# Patient Record
Sex: Female | Born: 1985 | Race: White | Hispanic: No | Marital: Married | Smoking: Former smoker
Health system: Southern US, Community
[De-identification: ages and names within clinical notes are randomized; demographics above are authoritative.]

## PROBLEM LIST (undated history)

## (undated) ENCOUNTER — Inpatient Hospital Stay (HOSPITAL_COMMUNITY): Payer: Self-pay

## (undated) DIAGNOSIS — F419 Anxiety disorder, unspecified: Secondary | ICD-10-CM

## (undated) DIAGNOSIS — Z789 Other specified health status: Secondary | ICD-10-CM

## (undated) DIAGNOSIS — J302 Other seasonal allergic rhinitis: Secondary | ICD-10-CM

## (undated) DIAGNOSIS — O139 Gestational [pregnancy-induced] hypertension without significant proteinuria, unspecified trimester: Secondary | ICD-10-CM

## (undated) DIAGNOSIS — J45909 Unspecified asthma, uncomplicated: Secondary | ICD-10-CM

## (undated) DIAGNOSIS — F329 Major depressive disorder, single episode, unspecified: Secondary | ICD-10-CM

## (undated) DIAGNOSIS — F32A Depression, unspecified: Secondary | ICD-10-CM

## (undated) HISTORY — DX: Depression, unspecified: F32.A

## (undated) HISTORY — DX: Unspecified asthma, uncomplicated: J45.909

## (undated) HISTORY — PX: EYE SURGERY: SHX253

## (undated) HISTORY — DX: Gestational (pregnancy-induced) hypertension without significant proteinuria, unspecified trimester: O13.9

## (undated) HISTORY — DX: Anxiety disorder, unspecified: F41.9

## (undated) HISTORY — PX: OTHER SURGICAL HISTORY: SHX169

## (undated) HISTORY — DX: Major depressive disorder, single episode, unspecified: F32.9

---
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
## 2010-11-13 ENCOUNTER — Emergency Department (HOSPITAL_COMMUNITY)
Admission: EM | Admit: 2010-11-13 | Discharge: 2010-11-13 | Payer: Self-pay | Source: Home / Self Care | Admitting: Family Medicine

## 2010-11-14 LAB — WET PREP, GENITAL
Clue Cells Wet Prep HPF POC: NONE SEEN
Trich, Wet Prep: NONE SEEN
Yeast Wet Prep HPF POC: NONE SEEN

## 2010-11-14 LAB — GLUCOSE, CAPILLARY: Glucose-Capillary: 78 mg/dL (ref 70–99)

## 2010-11-19 LAB — HERPES SIMPLEX VIRUS CULTURE: Culture: DETECTED

## 2010-11-19 LAB — GC/CHLAMYDIA PROBE AMP, GENITAL
Chlamydia, DNA Probe: NEGATIVE
GC Probe Amp, Genital: NEGATIVE

## 2011-12-17 ENCOUNTER — Ambulatory Visit (INDEPENDENT_AMBULATORY_CARE_PROVIDER_SITE_OTHER): Payer: BC Managed Care – PPO | Admitting: Physician Assistant

## 2011-12-17 VITALS — BP 118/78 | HR 93 | Temp 97.6°F | Resp 16 | Ht 63.75 in | Wt 172.2 lb

## 2011-12-17 DIAGNOSIS — J040 Acute laryngitis: Secondary | ICD-10-CM

## 2011-12-17 DIAGNOSIS — J069 Acute upper respiratory infection, unspecified: Secondary | ICD-10-CM

## 2011-12-17 DIAGNOSIS — R05 Cough: Secondary | ICD-10-CM

## 2011-12-17 DIAGNOSIS — J029 Acute pharyngitis, unspecified: Secondary | ICD-10-CM

## 2011-12-17 LAB — POCT CBC
Granulocyte percent: 62.6 %G (ref 37–80)
HCT, POC: 40.4 % (ref 37.7–47.9)
MCV: 86.9 fL (ref 80–97)
POC LYMPH PERCENT: 30.3 %L (ref 10–50)
RBC: 4.65 M/uL (ref 4.04–5.48)
WBC: 7.8 10*3/uL (ref 4.6–10.2)

## 2011-12-17 LAB — POCT RAPID STREP A (OFFICE): Rapid Strep A Screen: NEGATIVE

## 2011-12-17 MED ORDER — BENZONATATE 100 MG PO CAPS: ORAL_CAPSULE | ORAL | Status: AC

## 2011-12-17 MED ORDER — IPRATROPIUM BROMIDE 0.03 % NA SOLN: 2.0000 | Freq: Two times a day (BID) | NASAL | Status: DC

## 2011-12-17 MED ORDER — DM-GUAIFENESIN ER 60-1200 MG PO TB12: 1.0000 | ORAL_TABLET | Freq: Two times a day (BID) | ORAL | Status: AC

## 2011-12-17 NOTE — Patient Instructions (Signed)
Continue with the Mucinex, 1200 mg of the extended-release product every 12 hours.  Drink at least 64 ounces of water each day.  Add the nasal spray and cough med to help open up your sinus passages and reduce the cough.  If you are not improved over the next 24-48 hours, please call.

## 2011-12-17 NOTE — Progress Notes (Signed)
  Subjective:    Patient ID: Laura Jenkins, female    DOB: [birth year], 26 y.o.   MRN: [id number]  Fever  This is a new problem. The current episode started in the past 7 days. The problem occurs constantly. The problem has been gradually improving. Her temperature was unmeasured prior to arrival. Associated symptoms include congestion, coughing, headaches, a rash and a sore throat. Pertinent negatives include no abdominal pain, chest pain, diarrhea, ear pain, nausea, sleepiness, urinary pain, vomiting or wheezing. Treatments tried: mucinex, OTC homeopathics. The treatment provided no relief.  URI  This is a new problem. The current episode started in the past 7 days. The problem has been waxing and waning. Associated symptoms include congestion, coughing, headaches, a rash, rhinorrhea, sinus pain, a sore throat and swollen glands. Pertinent negatives include no abdominal pain, chest pain, diarrhea, dysuria, ear pain, joint pain, joint swelling, nausea, neck pain, sneezing, vomiting or wheezing. The treatment provided no relief.      Review of Systems  HENT: Positive for congestion, sore throat and rhinorrhea. Negative for ear pain, sneezing and neck pain.   Respiratory: Positive for cough. Negative for wheezing.   Cardiovascular: Negative for chest pain.  Gastrointestinal: Negative for nausea, vomiting, abdominal pain and diarrhea.  Genitourinary: Negative for dysuria.  Musculoskeletal: Negative for joint pain.  Skin: Positive for rash.  Neurological: Positive for headaches.       Objective:   Physical Exam  Vitals reviewed. Constitutional: She is oriented to person, place, and time. Vital signs are normal. She appears well-developed and well-nourished. No distress.  HENT:  Head: Normocephalic and atraumatic.  Right Ear: Hearing, tympanic membrane, external ear and ear canal normal.  Left Ear: Hearing, tympanic membrane, external ear and ear canal normal.  Nose: Mucosal  edema and rhinorrhea present.  No foreign bodies. Right sinus exhibits no maxillary sinus tenderness and no frontal sinus tenderness. Left sinus exhibits no maxillary sinus tenderness and no frontal sinus tenderness.  Mouth/Throat: Uvula is midline, oropharynx is clear and moist and mucous membranes are normal. No uvula swelling. No oropharyngeal exudate.  Eyes: Conjunctivae and EOM are normal. Pupils are equal, round, and reactive to light. Right eye exhibits no discharge. Left eye exhibits no discharge. No scleral icterus.  Neck: Trachea normal, normal range of motion and full passive range of motion without pain. Neck supple. No mass and no thyromegaly present.  Cardiovascular: Normal rate, regular rhythm and normal heart sounds.   Pulmonary/Chest: Effort normal and breath sounds normal.  Lymphadenopathy:       Head (right side): No submandibular, no tonsillar, no preauricular, no posterior auricular and no occipital adenopathy present.       Head (left side): No submandibular, no tonsillar, no preauricular and no occipital adenopathy present.    She has no cervical adenopathy.       Right: No supraclavicular adenopathy present.       Left: No supraclavicular adenopathy present.  Neurological: She is alert and oriented to person, place, and time. She has normal strength. No cranial nerve deficit or sensory deficit.  Skin: Skin is warm, dry and intact. No rash noted.  Psychiatric: She has a normal mood and affect. Her speech is normal and behavior is normal.       Assessment & Plan:  1. Pharyngitis. 2. Viral URI  Continue with current treatment. Add Atrovent NS BID and tessalon prn.  Anticipate improvement in 24-48 hours.

## 2012-03-05 ENCOUNTER — Ambulatory Visit (INDEPENDENT_AMBULATORY_CARE_PROVIDER_SITE_OTHER): Payer: BC Managed Care – PPO | Admitting: Physician Assistant

## 2012-03-05 VITALS — BP 110/68 | HR 92 | Temp 98.4°F | Resp 16 | Ht 63.5 in | Wt 170.0 lb

## 2012-03-05 DIAGNOSIS — J020 Streptococcal pharyngitis: Secondary | ICD-10-CM

## 2012-03-05 DIAGNOSIS — J029 Acute pharyngitis, unspecified: Secondary | ICD-10-CM

## 2012-03-05 LAB — POCT RAPID STREP A (OFFICE): Rapid Strep A Screen: POSITIVE — AB

## 2012-03-05 MED ORDER — PENICILLIN G BENZATHINE 1200000 UNIT/2ML IM SUSP
1.2000 10*6.[IU] | Freq: Once | INTRAMUSCULAR | Status: AC
  Administered 2012-03-05: 1.2 10*6.[IU] via INTRAMUSCULAR

## 2012-03-05 NOTE — Patient Instructions (Signed)
Strep Throat Strep throat is an infection of the throat caused by a bacteria named Streptococcus pyogenes. Your caregiver may call the infection streptococcal "tonsillitis" or "pharyngitis" depending on whether there are signs of inflammation in the tonsils or back of the throat. Strep throat is most common in children from 57 to 26 years old during the cold months of the year, but it can occur in people of any age during any season. This infection is spread from person to person (contagious) through coughing, sneezing, or other close contact. SYMPTOMS   Fever or chills.   Painful, swollen, red tonsils or throat.   Pain or difficulty when swallowing.   White or yellow spots on the tonsils or throat.   Swollen, tender lymph nodes or "glands" of the neck or under the jaw.   Red rash all over the body (rare).  DIAGNOSIS  Many different infections can cause the same symptoms. A test must be done to confirm the diagnosis so the right treatment can be given. A "rapid strep test" can help your caregiver make the diagnosis in a few minutes. If this test is not available, a light swab of the infected area can be used for a throat culture test. If a throat culture test is done, results are usually available in a day or two. TREATMENT  Strep throat is treated with antibiotic medicine. HOME CARE INSTRUCTIONS   Gargle with 1 tsp of salt in 1 cup of warm water, 3 to 4 times per day or as needed for comfort.   Family members who also have a sore throat or fever should be tested for strep throat and treated with antibiotics if they have the strep infection.   Make sure everyone in your household washes their hands well.   Do not share food, drinking cups, or personal items that could cause the infection to spread to others.   You may need to eat a soft food diet until your sore throat gets better.   Drink enough water and fluids to keep your urine clear or pale yellow. This will help prevent  dehydration.   Get plenty of rest.   Stay home from school, daycare, or work until you have been on antibiotics for 24 hours.   Only take over-the-counter or prescription medicines for pain, discomfort, or fever as directed by your caregiver.   If antibiotics are prescribed, take them as directed. Finish them even if you start to feel better.  SEEK MEDICAL CARE IF:   The glands in your neck continue to enlarge.   You develop a rash, cough, or earache.   You cough up green, yellow-brown, or bloody sputum.   You have pain or discomfort not controlled by medicines.   Your problems seem to be getting worse rather than better.  SEEK IMMEDIATE MEDICAL CARE IF:   You develop any new symptoms such as vomiting, severe headache, stiff or painful neck, chest pain, shortness of breath, or trouble swallowing.   You develop severe throat pain, drooling, or changes in your voice.   You develop swelling of the neck, or the skin on the neck becomes red and tender.   You have a fever.   You develop signs of dehydration, such as fatigue, dry mouth, and decreased urination.   You become increasingly sleepy, or you cannot wake up completely.  Document Released: 10/11/2000 Document Revised: 10/03/2011 Document Reviewed: 12/13/2010 [facility] Patient Information 2012 [place], [place].  You may take Ibuprofen and Acetaminophen for the pain of  your throat, and any fever. Drink at least 64 ounces of water daily and get plenty of rest.

## 2012-03-06 NOTE — Progress Notes (Signed)
  Subjective:    Patient ID: Laura Jenkins, female    DOB: [birth year], 26 y.o.   MRN: [id number]  HPI  Presents with several days of ST.  Initially had some fever/chills.  Feels tired.  No GI/GU symptoms.  No allergic-type symptoms. No cough. Feels different from the ST she gets will allergies and drainage.  Review of Systems As above.    Objective:   Physical Exam  Vital signs noted. Well-developed, well nourished WF who is awake, alert and oriented, in NAD. HEENT: [place]/AT, PERRL, EOMI.  Sclera and conjunctiva are clear.  EAC are patent, TMs are normal in appearance. Nasal mucosa is pink and moist. OP is clear. Neck: supple, no thyromegaly.  Bilateral cervical lymphadenopathy, mildly tender. Heart: RRR, no murmur Lungs: CTA Abdomen: normo-active bowel sounds, supple, non-tender, no mass or organomegaly. Extremities: no cyanosis, clubbing or edema. Skin: warm and dry without rash.  Results for orders placed in visit on 03/05/12  POCT RAPID STREP A (OFFICE)      Component Value Range   Rapid Strep A Screen Positive (*) Negative         Assessment & Plan:   1. Acute pharyngitis  POCT rapid strep A  2. Streptococcal sore throat  penicillin g benzathine (BICILLIN LA) 1200000 UNIT/2ML injection 1.2 Million Units   Anticipatory guidance provided.  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  Supportive care.

## 2014-04-22 ENCOUNTER — Encounter (HOSPITAL_COMMUNITY): Payer: Self-pay | Admitting: Emergency Medicine

## 2014-04-22 ENCOUNTER — Emergency Department (HOSPITAL_COMMUNITY)
Admission: EM | Admit: 2014-04-22 | Discharge: 2014-04-22 | Disposition: A | Discharge status: Home / Self Care | Payer: No Typology Code available for payment source | Attending: Emergency Medicine | Admitting: Emergency Medicine

## 2014-04-22 ENCOUNTER — Emergency Department (HOSPITAL_COMMUNITY): Payer: No Typology Code available for payment source

## 2014-04-22 DIAGNOSIS — Y92410 Unspecified street and highway as the place of occurrence of the external cause: Secondary | ICD-10-CM | POA: Insufficient documentation

## 2014-04-22 DIAGNOSIS — S2239XA Fracture of one rib, unspecified side, initial encounter for closed fracture: Secondary | ICD-10-CM | POA: Insufficient documentation

## 2014-04-22 DIAGNOSIS — M545 Low back pain, unspecified: Secondary | ICD-10-CM | POA: Diagnosis not present

## 2014-04-22 DIAGNOSIS — Y9389 Activity, other specified: Secondary | ICD-10-CM | POA: Diagnosis not present

## 2014-04-22 DIAGNOSIS — IMO0002 Reserved for concepts with insufficient information to code with codable children: Secondary | ICD-10-CM | POA: Diagnosis not present

## 2014-04-22 DIAGNOSIS — Z23 Encounter for immunization: Secondary | ICD-10-CM | POA: Insufficient documentation

## 2014-04-22 DIAGNOSIS — Z87891 Personal history of nicotine dependence: Secondary | ICD-10-CM | POA: Insufficient documentation

## 2014-04-22 DIAGNOSIS — S2231XA Fracture of one rib, right side, initial encounter for closed fracture: Secondary | ICD-10-CM

## 2014-04-22 MED ORDER — TETANUS-DIPHTH-ACELL PERTUSSIS 5-2.5-18.5 LF-MCG/0.5 IM SUSP
0.5000 mL | Freq: Once | INTRAMUSCULAR | Status: AC
  Administered 2014-04-22: 0.5 mL via INTRAMUSCULAR
  Filled 2014-04-22: qty 0.5

## 2014-04-22 MED ORDER — HYDROCODONE-ACETAMINOPHEN 5-325 MG PO TABS: 1.0000 | ORAL_TABLET | ORAL | Status: DC | PRN

## 2014-04-22 MED ORDER — IBUPROFEN 400 MG PO TABS
800.0000 mg | ORAL_TABLET | Freq: Once | ORAL | Status: AC
  Administered 2014-04-22: 800 mg via ORAL
  Filled 2014-04-22: qty 2

## 2014-04-22 MED ORDER — CYCLOBENZAPRINE HCL 10 MG PO TABS: 10.0000 mg | ORAL_TABLET | Freq: Three times a day (TID) | ORAL | Status: DC | PRN
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
## 2014-05-01 NOTE — ED Provider Notes (Signed)
Medical screening examination/treatment/procedure(s) were performed by non-physician practitioner and as supervising physician I was immediately available for consultation/collaboration.   EKG Interpretation None       Donnetta HutchingBrian Antaeus Karel, MD 05/01/14 2027

## 2014-10-28 NOTE — L&D Delivery Note (Signed)
Delivery Note  First Stage: Labor onset: 1400 on 09/26/2015 InductionAugmentation : Foley Balloon and Pitocin Analgesia /Anesthesia intrapartum: Fentanyl and Epidural AROM at 2337  Second Stage: Complete dilation at 0325 Onset of pushing at 0336 FHR second stage 145  Delivery of a viable female at 220415 by CNM in OA position No nuchal cord Cord double clamped immediately after delivery d/t increased laryngeal mucous and need for closer assessment in infant warmer, cut by FOB Cord blood sample collected    Third Stage: Placenta delivered via Tomasa BlaseSchultz intact with 3 VC @ 0426 Placenta disposition: L&D Uterine tone firm / bleeding minimal  1st degree vaginal and LT labia minora laceration identified  Anesthesia for repair: Epidural and Lidocaine (per pt request) Repair 3-0, 4-0 vicryl Est. Blood Loss (mL): 150  Complications: difficult fetal transition to external living - grunting, retractions / O2 and deep suctioning with DeLee by RN staff / CNM requested NICU evaluation / RN staff called nursery and instructed to bring infant to nursery immediately   Mom to postpartum.  Baby to Nursery on portable O2.  Newborn: Birth Weight: 7 lbs 3.9 oz  Apgar Scores: 6/7 Feeding planned: breast  Laura MoraAWSON, Graylin Sperling, M  MSN, CNM 09/27/2015, 5:00 AM

## 2015-04-10 LAB — OB RESULTS CONSOLE ABO/RH: RH Type: POSITIVE

## 2015-04-10 LAB — OB RESULTS CONSOLE GC/CHLAMYDIA
CHLAMYDIA, DNA PROBE: NEGATIVE
GC PROBE AMP, GENITAL: NEGATIVE

## 2015-04-10 LAB — OB RESULTS CONSOLE HEPATITIS B SURFACE ANTIGEN: Hepatitis B Surface Ag: NEGATIVE

## 2015-04-10 LAB — OB RESULTS CONSOLE RUBELLA ANTIBODY, IGM: RUBELLA: IMMUNE

## 2015-04-10 LAB — OB RESULTS CONSOLE ANTIBODY SCREEN: Antibody Screen: NEGATIVE

## 2015-04-10 LAB — OB RESULTS CONSOLE RPR: RPR: NONREACTIVE

## 2015-04-10 LAB — OB RESULTS CONSOLE HIV ANTIBODY (ROUTINE TESTING): HIV: NONREACTIVE

## 2015-09-07 LAB — OB RESULTS CONSOLE GBS: GBS: NEGATIVE

## 2015-09-26 ENCOUNTER — Encounter (HOSPITAL_COMMUNITY): Payer: Self-pay | Admitting: Obstetrics and Gynecology

## 2015-09-26 ENCOUNTER — Inpatient Hospital Stay (HOSPITAL_COMMUNITY)
Admission: AD | Admit: 2015-09-26 | Discharge: 2015-09-29 | DRG: 775 | Disposition: A | Discharge status: Home / Self Care | Payer: 59 | Source: Ambulatory Visit | Attending: Obstetrics & Gynecology | Admitting: Obstetrics & Gynecology

## 2015-09-26 DIAGNOSIS — O139 Gestational [pregnancy-induced] hypertension without significant proteinuria, unspecified trimester: Secondary | ICD-10-CM | POA: Diagnosis present

## 2015-09-26 DIAGNOSIS — D649 Anemia, unspecified: Secondary | ICD-10-CM | POA: Diagnosis not present

## 2015-09-26 DIAGNOSIS — O134 Gestational [pregnancy-induced] hypertension without significant proteinuria, complicating childbirth: Secondary | ICD-10-CM | POA: Diagnosis present

## 2015-09-26 DIAGNOSIS — Z3A39 39 weeks gestation of pregnancy: Secondary | ICD-10-CM | POA: Diagnosis not present

## 2015-09-26 DIAGNOSIS — O368130 Decreased fetal movements, third trimester, not applicable or unspecified: Secondary | ICD-10-CM | POA: Diagnosis present

## 2015-09-26 DIAGNOSIS — O9081 Anemia of the puerperium: Secondary | ICD-10-CM | POA: Diagnosis not present

## 2015-09-26 DIAGNOSIS — Z87891 Personal history of nicotine dependence: Secondary | ICD-10-CM

## 2015-09-26 HISTORY — DX: Other specified health status: Z78.9

## 2015-09-26 LAB — COMPREHENSIVE METABOLIC PANEL
ALK PHOS: 182 U/L — AB (ref 38–126)
ALT: 14 U/L (ref 14–54)
AST: 41 U/L (ref 15–41)
Albumin: 2.6 g/dL — ABNORMAL LOW (ref 3.5–5.0)
Anion gap: 11 (ref 5–15)
BUN: 9 mg/dL (ref 6–20)
CALCIUM: 9.2 mg/dL (ref 8.9–10.3)
CHLORIDE: 104 mmol/L (ref 101–111)
CO2: 18 mmol/L — AB (ref 22–32)
CREATININE: 0.57 mg/dL (ref 0.44–1.00)
Glucose, Bld: 101 mg/dL — ABNORMAL HIGH (ref 65–99)
Potassium: 4.9 mmol/L (ref 3.5–5.1)
Sodium: 133 mmol/L — ABNORMAL LOW (ref 135–145)
Total Bilirubin: 1 mg/dL (ref 0.3–1.2)
Total Protein: 6.5 g/dL (ref 6.5–8.1)

## 2015-09-26 LAB — CBC
HEMATOCRIT: 33.4 % — AB (ref 36.0–46.0)
HEMOGLOBIN: 11 g/dL — AB (ref 12.0–15.0)
MCH: 26.3 pg (ref 26.0–34.0)
MCHC: 32.9 g/dL (ref 30.0–36.0)
MCV: 79.9 fL (ref 78.0–100.0)
Platelets: 312 10*3/uL (ref 150–400)
RBC: 4.18 MIL/uL (ref 3.87–5.11)
RDW: 15 % (ref 11.5–15.5)
WBC: 16.2 10*3/uL — AB (ref 4.0–10.5)

## 2015-09-26 LAB — TYPE AND SCREEN
ABO/RH(D): O POS
ANTIBODY SCREEN: NEGATIVE

## 2015-09-26 LAB — PROTEIN / CREATININE RATIO, URINE
CREATININE, URINE: 106 mg/dL
Protein Creatinine Ratio: 0.18 mg/mg{Cre} — ABNORMAL HIGH (ref 0.00–0.15)
TOTAL PROTEIN, URINE: 19 mg/dL

## 2015-09-26 LAB — URIC ACID: Uric Acid, Serum: 6.3 mg/dL (ref 2.3–6.6)

## 2015-09-26 MED ORDER — FENTANYL CITRATE (PF) 100 MCG/2ML IJ SOLN
100.0000 ug | INTRAMUSCULAR | Status: DC | PRN
  Administered 2015-09-26 – 2015-09-27 (×2): 100 ug via INTRAVENOUS
  Filled 2015-09-26 (×2): qty 2

## 2015-09-26 MED ORDER — OXYTOCIN 40 UNITS IN LACTATED RINGERS INFUSION - SIMPLE MED
1.0000 m[IU]/min | INTRAVENOUS | Status: DC
  Administered 2015-09-26: 2 m[IU]/min via INTRAVENOUS
  Filled 2015-09-26: qty 1000

## 2015-09-26 MED ORDER — OXYCODONE-ACETAMINOPHEN 5-325 MG PO TABS: 1.0000 | ORAL_TABLET | ORAL | Status: DC | PRN

## 2015-09-26 MED ORDER — LACTATED RINGERS IV SOLN
INTRAVENOUS | Status: DC
  Administered 2015-09-26 – 2015-09-27 (×3): via INTRAVENOUS

## 2015-09-26 MED ORDER — LIDOCAINE HCL (PF) 1 % IJ SOLN
30.0000 mL | INTRAMUSCULAR | Status: AC | PRN
  Administered 2015-09-27: 30 mL via SUBCUTANEOUS
  Filled 2015-09-26: qty 30

## 2015-09-26 MED ORDER — OXYTOCIN 40 UNITS IN LACTATED RINGERS INFUSION - SIMPLE MED
62.5000 mL/h | INTRAVENOUS | Status: DC
  Administered 2015-09-27: 999 mL/h via INTRAVENOUS
  Filled 2015-09-26: qty 1000

## 2015-09-26 MED ORDER — TERBUTALINE SULFATE 1 MG/ML IJ SOLN
0.2500 mg | Freq: Once | INTRAMUSCULAR | Status: DC | PRN
  Filled 2015-09-26: qty 1

## 2015-09-26 MED ORDER — LACTATED RINGERS IV SOLN
500.0000 mL | INTRAVENOUS | Status: DC | PRN
  Administered 2015-09-26 (×2): 250 mL via INTRAVENOUS
  Administered 2015-09-27: 500 mL via INTRAVENOUS

## 2015-09-26 MED ORDER — CITRIC ACID-SODIUM CITRATE 334-500 MG/5ML PO SOLN: 30.0000 mL | ORAL | Status: DC | PRN

## 2015-09-26 MED ORDER — OXYTOCIN BOLUS FROM INFUSION: 500.0000 mL | INTRAVENOUS | Status: DC

## 2015-09-26 MED ORDER — OXYTOCIN 10 UNIT/ML IJ SOLN
10.0000 [IU] | Freq: Once | INTRAMUSCULAR | Status: DC | PRN
  Filled 2015-09-26: qty 1

## 2015-09-26 MED ORDER — OXYCODONE-ACETAMINOPHEN 5-325 MG PO TABS: 2.0000 | ORAL_TABLET | ORAL | Status: DC | PRN

## 2015-09-26 MED ORDER — ZOLPIDEM TARTRATE 5 MG PO TABS: 5.0000 mg | ORAL_TABLET | Freq: Every evening | ORAL | Status: DC | PRN

## 2015-09-26 MED ORDER — ACETAMINOPHEN 325 MG PO TABS: 650.0000 mg | ORAL_TABLET | ORAL | Status: DC | PRN

## 2015-09-27 ENCOUNTER — Inpatient Hospital Stay (HOSPITAL_COMMUNITY): Payer: 59 | Admitting: Anesthesiology

## 2015-09-27 ENCOUNTER — Encounter (HOSPITAL_COMMUNITY): Payer: Self-pay | Admitting: Obstetrics and Gynecology

## 2015-09-27 LAB — RPR: RPR Ser Ql: NONREACTIVE

## 2015-09-27 LAB — ABO/RH: ABO/RH(D): O POS

## 2015-09-27 MED ORDER — SIMETHICONE 80 MG PO CHEW: 80.0000 mg | CHEWABLE_TABLET | ORAL | Status: DC | PRN

## 2015-09-27 MED ORDER — LIDOCAINE HCL (PF) 1 % IJ SOLN
INTRAMUSCULAR | Status: DC | PRN
  Administered 2015-09-27 (×2): 8 mL via EPIDURAL

## 2015-09-27 MED ORDER — TETANUS-DIPHTH-ACELL PERTUSSIS 5-2.5-18.5 LF-MCG/0.5 IM SUSP: 0.5000 mL | Freq: Once | INTRAMUSCULAR | Status: DC

## 2015-09-27 MED ORDER — PHENYLEPHRINE 40 MCG/ML (10ML) SYRINGE FOR IV PUSH (FOR BLOOD PRESSURE SUPPORT)
80.0000 ug | PREFILLED_SYRINGE | INTRAVENOUS | Status: DC | PRN
  Filled 2015-09-27: qty 20
  Filled 2015-09-27: qty 2

## 2015-09-27 MED ORDER — OXYCODONE-ACETAMINOPHEN 5-325 MG PO TABS
1.0000 | ORAL_TABLET | ORAL | Status: DC | PRN
  Administered 2015-09-27 – 2015-09-28 (×2): 1 via ORAL
  Filled 2015-09-27: qty 1

## 2015-09-27 MED ORDER — FENTANYL 2.5 MCG/ML BUPIVACAINE 1/10 % EPIDURAL INFUSION (WH - ANES)
14.0000 mL/h | INTRAMUSCULAR | Status: DC | PRN
  Administered 2015-09-27: 14 mL/h via EPIDURAL
  Filled 2015-09-27: qty 125

## 2015-09-27 MED ORDER — DIPHENHYDRAMINE HCL 25 MG PO CAPS: 25.0000 mg | ORAL_CAPSULE | Freq: Four times a day (QID) | ORAL | Status: DC | PRN

## 2015-09-27 MED ORDER — DIBUCAINE 1 % RE OINT: 1.0000 "application " | TOPICAL_OINTMENT | RECTAL | Status: DC | PRN

## 2015-09-27 MED ORDER — PRENATAL MULTIVITAMIN CH
1.0000 | ORAL_TABLET | Freq: Every day | ORAL | Status: DC
  Administered 2015-09-27 – 2015-09-29 (×3): 1 via ORAL
  Filled 2015-09-27 (×3): qty 1

## 2015-09-27 MED ORDER — ONDANSETRON HCL 4 MG PO TABS: 4.0000 mg | ORAL_TABLET | ORAL | Status: DC | PRN

## 2015-09-27 MED ORDER — BENZOCAINE-MENTHOL 20-0.5 % EX AERO
1.0000 "application " | INHALATION_SPRAY | CUTANEOUS | Status: DC | PRN
  Administered 2015-09-27 – 2015-09-28 (×2): 1 via TOPICAL
  Filled 2015-09-27 (×2): qty 56

## 2015-09-27 MED ORDER — SENNOSIDES-DOCUSATE SODIUM 8.6-50 MG PO TABS
2.0000 | ORAL_TABLET | ORAL | Status: DC
  Administered 2015-09-28 (×2): 2 via ORAL
  Filled 2015-09-27 (×2): qty 2

## 2015-09-27 MED ORDER — WITCH HAZEL-GLYCERIN EX PADS: 1.0000 "application " | MEDICATED_PAD | CUTANEOUS | Status: DC | PRN

## 2015-09-27 MED ORDER — OXYCODONE-ACETAMINOPHEN 5-325 MG PO TABS
2.0000 | ORAL_TABLET | ORAL | Status: DC | PRN
  Filled 2015-09-27: qty 2

## 2015-09-27 MED ORDER — IPRATROPIUM BROMIDE 0.03 % NA SOLN: 2.0000 | Freq: Two times a day (BID) | NASAL | Status: DC

## 2015-09-27 MED ORDER — DIPHENHYDRAMINE HCL 50 MG/ML IJ SOLN: 12.5000 mg | INTRAMUSCULAR | Status: DC | PRN

## 2015-09-27 MED ORDER — IBUPROFEN 600 MG PO TABS
600.0000 mg | ORAL_TABLET | Freq: Four times a day (QID) | ORAL | Status: DC
  Administered 2015-09-27 – 2015-09-29 (×8): 600 mg via ORAL
  Filled 2015-09-27 (×8): qty 1

## 2015-09-27 MED ORDER — LORATADINE 10 MG PO TABS
10.0000 mg | ORAL_TABLET | Freq: Every day | ORAL | Status: DC
  Filled 2015-09-27: qty 1

## 2015-09-27 MED ORDER — EPHEDRINE 5 MG/ML INJ
10.0000 mg | INTRAVENOUS | Status: DC | PRN
  Filled 2015-09-27: qty 2

## 2015-09-27 MED ORDER — ACETAMINOPHEN 325 MG PO TABS: 650.0000 mg | ORAL_TABLET | ORAL | Status: DC | PRN

## 2015-09-27 MED ORDER — ZOLPIDEM TARTRATE 5 MG PO TABS: 5.0000 mg | ORAL_TABLET | Freq: Every evening | ORAL | Status: DC | PRN

## 2015-09-27 MED ORDER — ONDANSETRON HCL 4 MG/2ML IJ SOLN: 4.0000 mg | INTRAMUSCULAR | Status: DC | PRN

## 2015-09-27 NOTE — Progress Notes (Signed)
Patient ID: Laura Jenkins, female   DOB: 28-Jan-1986, 29 y.o.   MRN: [id number] TC @ 0158 to come for delivery  S: Very anxious, crying. Reporting feeling "lots of pressure like I have to poop!" Pain minimally controlled with an epidural d/t delay with insertion. Strong urge to push.   Ceasar Mons: Filed Vitals:   09/27/15 0419 09/27/15 0425 09/27/15 0430 09/27/15 0446  BP: 144/77 131/95 140/58 98/53  Pulse: 108 120 109 126  Temp:      TempSrc:      Resp:      Height:      Weight:      SpO2:         FHT:  FHR: 135 bpm, variability: moderate,  accelerations:  Present,  decelerations:  Present early decels with contractions UC:   regular, every 1-2 minutes SVE:   Dilation: 9.5 Effacement (%): 100 Station: +1 Exam by: Carloyn Jaeger. Dovie Kapusta, CNM   A / P: Induction of labor due to elective d/t transient/labile BPs,  progressing well on pitocin  Fetal Wellbeing:  Category I Pain Control:  Epidural Anticipate complete dilation within 30-60 mins / attempt laboring down for as long as tolerable / attempt pushing with constant urge to push Anticipated MOD:  NSVD   Raelyn MoraAWSON, Joanthony Hamza, M MSN, CNM 09/27/2015, 2:00 AM

## 2015-09-27 NOTE — Anesthesia Postprocedure Evaluation (Addendum)
Anesthesia Post Note  Patient: Laura Jenkins  Procedure(s) Performed: * No procedures listed *  Patient location during evaluation: Mother Baby Anesthesia Type: Epidural Level of consciousness: awake Pain management: satisfactory to patient Vital Signs Assessment: post-procedure vital signs reviewed and stable Respiratory status: spontaneous breathing Cardiovascular status: stable Anesthetic complications: no    Last Vitals:  Filed Vitals:   09/27/15 0650 09/27/15 0800  BP: 143/74 136/61  Pulse: 152 101  Temp: 36.7 C 36.7 C  Resp: 20 20    Last Pain:  Filed Vitals:   09/27/15 0828  PainSc: 1                  Avyn Aden

## 2015-09-27 NOTE — Anesthesia Procedure Notes (Signed)
Epidural Patient location during procedure: OB Start time: 09/27/2015 1:48 AM End time: 09/27/2015 1:52 AM  Staffing Anesthesiologist: Leilani AbleHATCHETT, Cele Mote Performed by: anesthesiologist   Preanesthetic Checklist Completed: patient identified, surgical consent, pre-op evaluation, timeout performed, IV checked, risks and benefits discussed and monitors and equipment checked  Epidural Patient position: sitting Prep: site prepped and draped and DuraPrep Patient monitoring: continuous pulse ox and blood pressure Approach: midline Location: L3-L4 Injection technique: LOR air  Needle:  Needle type: Tuohy  Needle gauge: 17 G Needle length: 9 cm and 9 Needle insertion depth: 7 cm Catheter type: closed end flexible Catheter size: 19 Gauge Catheter at skin depth: 12 cm Test dose: negative and Other  Assessment Sensory level: T9 Events: blood not aspirated, injection not painful, no injection resistance, negative IV test and no paresthesia  Additional Notes Reason for block:procedure for pain

## 2015-09-27 NOTE — Anesthesia Preprocedure Evaluation (Signed)
Anesthesia Evaluation  Patient identified by MRN, date of birth, ID band Patient awake    Reviewed: Allergy & Precautions, H&P , NPO status , Patient's Chart, lab work & pertinent test results  Airway Mallampati: I  TM Distance: >3 FB Neck ROM: full    Dental no notable dental hx.    Pulmonary former smoker,    Pulmonary exam normal        Cardiovascular negative cardio ROS Normal cardiovascular exam Rhythm:regular     Neuro/Psych negative neurological ROS  negative psych ROS   GI/Hepatic negative GI ROS, Neg liver ROS,   Endo/Other  negative endocrine ROS  Renal/GU negative Renal ROS     Musculoskeletal   Abdominal (+) + obese,   Peds  Hematology negative hematology ROS (+)   Anesthesia Other Findings   Reproductive/Obstetrics (+) Pregnancy                             Anesthesia Physical Anesthesia Plan  ASA: II  Anesthesia Plan: Epidural   Post-op Pain Management:    Induction:   Airway Management Planned:   Additional Equipment:   Intra-op Plan:   Post-operative Plan:   Informed Consent: I have reviewed the patients History and Physical, chart, labs and discussed the procedure including the risks, benefits and alternatives for the proposed anesthesia with the patient or authorized representative who has indicated his/her understanding and acceptance.     Plan Discussed with:   Anesthesia Plan Comments:         Anesthesia Quick Evaluation

## 2015-09-27 NOTE — H&P (Signed)
OB ADMISSION/ HISTORY & PHYSICAL:  Admission Date: 09/26/2015  5:36 PM  Admit Diagnosis: IOL for transient hypertension / decreased FM / category 2 FHR tracing in office / labile BPs in office x 1 week  Laura Jenkins is a 29 y.o. female presenting for IOL.  Prenatal History: G1P0   EDC : 10/02/2015, by Ultrasound Prenatal care at [facility] since [redacted] weeks gestation Primary Care Provider at [facility]: Dr. Juliene Pina  Prenatal course complicated by Transient hypertension in later third trimester  Prenatal Labs: ABO, Rh: O (06/13 0000)  Antibody: NEG (11/29 2125) Rubella: Immune (06/13 0000)  RPR: Nonreactive (06/13 0000)  HBsAg: Negative (06/13 0000)  HIV: Non-reactive (06/13 0000)  GBS: Negative (11/10 0000)  GTT : Abnormal 1 hr GTT / Normal 3 hr GTT   Medical / Surgical History :  Past medical history:  Past Medical History  Diagnosis Date  . Indication for care in labor or delivery 09/26/2015  . Medical history non-contributory      Past surgical history:  Past Surgical History  Procedure Laterality Date  . Strabismus surgery  1989     Family History: History reviewed. No pertinent family history.   Social History:  reports that she quit smoking about 6 years ago. She does not have any smokeless tobacco history on file. She reports that she does not drink alcohol or use illicit drugs.   Allergies: Review of patient's allergies indicates no known allergies.    Current Medications at time of admission:  Prescriptions prior to admission  Medication Sig Dispense Refill Last Dose  . calcium carbonate (TUMS - DOSED IN MG ELEMENTAL CALCIUM) 500 MG chewable tablet Chew 2 tablets by mouth daily as needed for indigestion or heartburn.   Past Week at Unknown time  . loratadine (CLARITIN) 10 MG tablet Take 10 mg by mouth daily.   09/26/2015 at Unknown time  . Prenatal Vit-Min-FA-Fish Oil ([employer] PRENATAL GUMMY PO) Take 2 each by mouth daily.    09/26/2015 at Unknown time  . valACYclovir (VALTREX) 500 MG tablet Take 500 mg by mouth daily.   09/26/2015 at Unknown time  . [DISCONTINUED] cyclobenzaprine (FLEXERIL) 10 MG tablet Take 1 tablet (10 mg total) by mouth 3 (three) times daily as needed for muscle spasms (or pain). 15 tablet 0   . [DISCONTINUED] HYDROcodone-acetaminophen (NORCO/VICODIN) 5-325 MG per tablet Take 1 tablet by mouth every 4 (four) hours as needed for moderate pain or severe pain. 15 tablet 0   . [DISCONTINUED] ipratropium (ATROVENT) 0.03 % nasal spray Place 2 sprays into the nose 2 (two) times daily. 30 mL 0       Review of Systems: Active FM onset of ctx @ 0800 currently irregular every 5-10 minutes bloody show none   Physical Exam:   Today's Vitals   09/26/15 2201 09/26/15 2230 09/26/15 2300 09/26/15 2308  BP: 123/59 97/57 109/58 93/52  Pulse: 87 93 84 89  Temp: 98.5 F (36.9 C)     TempSrc: Oral     Resp: 20     Height:      Weight:      PainSc: 0-No pain      General: A&O x 3, NAD Heart: RRR, no murmurs Lungs: CTAB, unlabored  Abdomen: gravid, soft, mild contractions Extremities: trace edema Genitalia / VE: Dilation: 1.0 Effacement (%): 90 Station: -1 Exam by:: Carloyn Jaeger, CNM  Foley Balloon inserted without difficulty / 60 mL of LR instilled in balloon of catheter /  traction applied  FHR: 140 bpm / moderate variability / accels present / no decels TOCO: regular every 1.5-3 mins  Labs:     Recent Labs  09/26/15 1800  WBC 16.2*  HGB 11.0*  HCT 33.4*  PLT 312   Results for orders placed or performed during the hospital encounter of 09/26/15 (from the past 24 hour(s))  CBC     Status: Abnormal   Collection Time: 09/26/15  6:00 PM  Result Value Ref Range   WBC 16.2 (H) 4.0 - 10.5 K/uL   RBC 4.18 3.87 - 5.11 MIL/uL   Hemoglobin 11.0 (L) 12.0 - 15.0 g/dL   HCT 16.133.4 (L) 09.636.0 - 04.546.0 %   MCV 79.9 78.0 - 100.0 fL   MCH 26.3 26.0 - 34.0 pg   MCHC 32.9 30.0 - 36.0 g/dL   RDW 40.915.0 81.111.5  - 91.415.5 %   Platelets 312 150 - 400 K/uL  Comprehensive metabolic panel     Status: Abnormal   Collection Time: 09/26/15  6:00 PM  Result Value Ref Range   Sodium 133 (L) 135 - 145 mmol/L   Potassium 4.9 3.5 - 5.1 mmol/L   Chloride 104 101 - 111 mmol/L   CO2 18 (L) 22 - 32 mmol/L   Glucose, Bld 101 (H) 65 - 99 mg/dL   BUN 9 6 - 20 mg/dL   Creatinine, Ser 7.820.57 0.44 - 1.00 mg/dL   Calcium 9.2 8.9 - 95.610.3 mg/dL   Total Protein 6.5 6.5 - 8.1 g/dL   Albumin 2.6 (L) 3.5 - 5.0 g/dL   AST 41 15 - 41 U/L   ALT 14 14 - 54 U/L   Alkaline Phosphatase 182 (H) 38 - 126 U/L   Total Bilirubin 1.0 0.3 - 1.2 mg/dL   GFR calc non Af Amer >60 >60 mL/min   GFR calc Af Amer >60 >60 mL/min   Anion gap 11 5 - 15  Uric acid     Status: None   Collection Time: 09/26/15  6:00 PM  Result Value Ref Range   Uric Acid, Serum 6.3 2.3 - 6.6 mg/dL  Protein / creatinine ratio, urine     Status: Abnormal   Collection Time: 09/26/15  6:45 PM  Result Value Ref Range   Creatinine, Urine 106.00 mg/dL   Total Protein, Urine 19 mg/dL   Protein Creatinine Ratio 0.18 (H) 0.00 - 0.15 mg/mg[Cre]  Type and screen [facility]     Status: None   Collection Time: 09/26/15  9:25 PM  Result Value Ref Range   ABO/RH(D) O POS    Antibody Screen NEG    Sample Expiration 09/29/2015      Assessment:  29 y.o. G1P0 at 5752w2d  1. Labor: Early 2. Fetal Wellbeing: Category 1  3. Pain Control: none 4. GBS: Negative   Plan:  1. Admit to BS 2. Routine L&D orders 3. Analgesia/anesthesia PRN  4. Insert Foley Balloon with pitocin / AROM when in active labor 5. D/C pitocin after AROM and in active labor, pending category 1 FHR tracing continues    Dr. Seymour BarsLavoie notified of admission / plan of care    [name], [name], M, MSN, CNM 09/26/2015, 10:30 PM

## 2015-09-27 NOTE — Progress Notes (Signed)
Patient ID: Dorathy DaftElizabeth M Ignelzi-Alvarenga, female   DOB: May 12, 1986, 29 y.o.   MRN: [id number]   Subjective: Dorathy Daftlizabeth M Ignelzi-Alvarenga is a 29 y.o. G1P0 at 922w2d by ultrasound admitted for induction of labor due to Elective at term for transient hypertension x 1 week. Planning waterbirth.  Objective: Filed Vitals:   09/26/15 2201 09/26/15 2230 09/26/15 2300 09/26/15 2308  BP: 123/59 97/57 109/58 93/52  Pulse: 87 93 84 89  Temp: 98.5 F (36.9 C)     TempSrc: Oral     Resp: 20     Height:      Weight:           FHT:  FHR: 140 bpm, variability: moderate,  accelerations:  Present,  decelerations:  Absent UC:   regular, every 4-5 minutes SVE:   Dilation: 4.5 Effacement (%): 90 Station: -1 Exam by:: Carloyn Jaeger. Tishanna Dunford, CNM AROM with clear fluid  Labs:   Recent Labs  09/26/15 1800  WBC 16.2*  HGB 11.0*  HCT 33.4*  PLT 312    Assessment / Plan: Induction of labor due to elective for transient hypertension,  progressing well on pitocin  Labor: Progressing on Pitocin, will continue to increase  Preeclampsia:  no signs or symptoms of toxicity and labs stable Fetal Wellbeing:  Category I Pain Control:  Fentanyl - requesting epidural I/D:  n/a Anticipated MOD:  NSVD  Kenard GowerAWSON, Jasminemarie Sherrard, M, MSN, CNM 09/27/2015, 11:30 PM

## 2015-09-27 NOTE — Lactation Note (Signed)
This note was copied from the chart of Laura Jenkins. Lactation Consultation Note:  Staff nurse request to see mother  stating that infant was having difficulty latching with increased RR. Mother also has increased anxiety due to infant not having a feeding . Infant is 6 hours old and has had 2 breast attempts no sustained latch. Mother states she has a history of anxiety. She states that her birth plan failed. Mother was given support .Lactation brochure given with basic teaching done. Mother taught to hand express. Infant was fed 10 ml of EBM with a spoon and began cueing. Mother has large amts of colostrum at this time. Infant latched on the right breast in football hold and sustained latch for 25 mins. Lots of teaching with mother and review of cue card and baby and me book. Father of infant returned . Assist mother with latching infant to the alternate breast. The left nipple is semi-flat . Mother taught to firm nipple with her fingers and to latch infant with nipple to nose technique. Infant sustained latch for 10 or more mins. Father at bedside for teaching. Mother advised to breastfeed infant with feeding cues at least 8-12 times in 24 hours. Advised to do frequent STS. Mother had 6 visitors waiting behind curtain to see the infant. Mother advised to nap . She has been awake all night. Cluster feeding discussed.   Patient Name: Laura Jenkins ZOXWR'UToday's Date: 09/27/2015 Reason for consult: Initial assessment   Maternal Data Has patient been taught Hand Expression?: Yes Does the patient have breastfeeding experience prior to this delivery?: No  Feeding Feeding Type: Breast Fed Length of feed: 10 min  LATCH Score/Interventions Latch: Grasps breast easily, tongue down, lips flanged, rhythmical sucking.  Audible Swallowing: Spontaneous and intermittent  Type of Nipple: Everted at rest and after stimulation  Comfort (Breast/Nipple): Soft / non-tender      Hold (Positioning): Assistance needed to correctly position infant at breast and maintain latch. Intervention(s): Breastfeeding basics reviewed;Support Pillows;Position options;Skin to skin  LATCH Score: 9  Lactation Tools Discussed/Used     Consult Status Consult Status: Follow-up Date: 09/27/15 Follow-up type: In-patient    Stevan BornKendrick, [facility] 09/27/2015, 2:45 PM

## 2015-09-28 ENCOUNTER — Encounter (HOSPITAL_COMMUNITY): Payer: Self-pay | Admitting: Obstetrics and Gynecology

## 2015-09-28 LAB — CBC
HEMATOCRIT: 28.8 % — AB (ref 36.0–46.0)
HEMOGLOBIN: 9.5 g/dL — AB (ref 12.0–15.0)
MCH: 26.5 pg (ref 26.0–34.0)
MCHC: 33 g/dL (ref 30.0–36.0)
MCV: 80.4 fL (ref 78.0–100.0)
Platelets: 343 10*3/uL (ref 150–400)
RBC: 3.58 MIL/uL — ABNORMAL LOW (ref 3.87–5.11)
RDW: 15.4 % (ref 11.5–15.5)
WBC: 17.4 10*3/uL — ABNORMAL HIGH (ref 4.0–10.5)

## 2015-09-28 NOTE — Progress Notes (Signed)
CSW received request for consult from RN due to MOB presenting as anxious, nervous, and overwhelmed.  CSW completed chart review and noted that MOB was nervous and anxious about breastfeeding and unanticipated change in her birth plan.   CSW attempted to meet with MOB; however, she was unable to meet at time of attempt.   CSW will continue to put forth effort to meet with MOB prior to discharge to provide support, to assist her to process her feelings, and assess need for follow up postpartum.  

## 2015-09-28 NOTE — Lactation Note (Signed)
This note was copied from the chart of Laura Jenkins. Lactation Consultation Note Having difficulty latching to breast. Nipples evert and compress to short shaft and baby unable to latch. Mom hasn't been wearing the shells that were given and #20 NS that was given. Explained to mom that for now she needed to use the NS to latch the baby until her edema to LE goes down and hopefully from her breast as well. Has lots of free flowing colostrum. Hand expressed 8ml colostrum. Baby latched well to NS in football hold. Mom wanting baby to constantly suckle. Explained baby needs to rest at intervals during BF. Noted increased respirations. Counted 95, then 10 min. Later 97. Reported to RN and Publishing rights managernursery RN. Explained to mom that the baby shouldn't BF if respirations are that high. Encouraged STS and call RN to let her know when she notices increased respirations. RN took baby to nursery to be monitored.  Mom has DEBP in room, encouraged to post pump if she feels baby didn't feed well. Explained probably would get more colostrum by hand expressing. Gave vial, curve tip syring, and dropper. Discussed options of BF.  Mom states she can't latch w/o help. Explained she needed to learn to latch w/o assistance and baby is ready to eat after respirations lower. Extensive teaching and teach back done to assist mom in "getting the understanding" of BF. Praised and encouraged as much as possible, repeat information several times for pt. Understanding.  Patient Name: Laura Jenkins RUEAV'WToday's Date: 09/28/2015 Reason for consult: Follow-up assessment;Difficult latch   Maternal Data    Feeding Feeding Type: Breast Milk Length of feed: 7 min  LATCH Score/Interventions Latch: Grasps breast easily, tongue down, lips flanged, rhythmical sucking. Intervention(s): Adjust position;Assist with latch;Breast massage;Breast compression  Audible Swallowing: Spontaneous and  intermittent Intervention(s): Skin to skin;Hand expression;Alternate breast massage  Type of Nipple: Everted at rest and after stimulation  Comfort (Breast/Nipple): Soft / non-tender     Hold (Positioning): Full assist, staff holds infant at breast Intervention(s): Breastfeeding basics reviewed;Support Pillows;Position options;Skin to skin  LATCH Score: 8  Lactation Tools Discussed/Used Tools: Shells;Nipple Dorris CarnesShields;Pump Nipple shield size: 20 Shell Type: Inverted Breast pump type: Double-Electric Breast Pump Pump Review: Setup, frequency, and cleaning;Milk Storage Initiated by:: RN Date initiated:: 09/28/15   Consult Status Consult Status: Follow-up Date: 09/28/15 Follow-up type: In-patient    Charyl DancerCARVER, Leelah Hanna G 09/28/2015, 6:49 AM

## 2015-09-28 NOTE — Lactation Note (Signed)
This note was copied from the chart of Laura Jenkins. Lactation Consultation Note  Patient Name: Laura Jenkins EXBMW'UToday's Date: 09/28/2015 Reason for consult: Follow-up assessment;Other (Comment) (baby is latched with depth and still feeding from the 1310 feeding )  Baby is 33 hours , initially was started latched , and then Nipple Shield was used in the middle of the night for latching.  MBU RN has assisted mom several times with latching today and has been successful without the use of a nipple shield.  LC started consult with baby already latched with depth and multiply swallows noted , increased with breast compressions.  Mom and dad pleased with how well the baby was feeding. With mom permission checked the other breast and noted the areolas  Having some edema when compressed. LC encouraged mom to wear shells between feedings , except when sleeping to enhance  Compressibility of areola for a more comfortable latch and a deep latch. LC showed mom how to use shells when she can get a bra on , which she has .  Baby latched for MBU RN at 1310 and still actively feeding at 1335.  Per mom plans to call the insurance company today to check on DEBP . LC discussed with mom if the insurance pump takes several days to arrive,  [facility]. Also mentioned to mom due to her early latching issues a strong plan B would be recommended.    Maternal Data Has patient been taught Hand Expression?: Yes (opposite breast , steady flwo and leaking )  Feeding Feeding Type:  (baby latched . ) Length of feed:  (multiply swallows noted , increased with breast compressions )  LATCH Score/Interventions Latch:  (latched with depth )  Audible Swallowing:  (multiply swallows , increased w/ breast compressions ) Intervention(s): Skin to skin;Hand expression (pre pump withhand pump )  Type of Nipple: Everted at rest and after stimulation Intervention(s): Hand  pump  Comfort (Breast/Nipple):  (per mom comfortable )     Hold (Positioning):  (MBU RN had assisted mom with .latch ) Intervention(s): Breastfeeding basics reviewed  LATCH Score: 8  Lactation Tools Discussed/Used Tools: Shells [facility](LC showed mom how to use them and encouraged to wear between feedings except when sleeping ) Nipple shield size:  (no nipple shield being used for this latch ) Shell Type: Inverted Breast pump type: Manual (per mom pre- pumped prior to latch )   Consult Status Consult Status: Follow-up Date: 09/29/15 Follow-up type: In-patient    Kathrin Greathouseorio, Laura Jenkins 09/28/2015, 1:50 PM

## 2015-09-28 NOTE — Progress Notes (Signed)
Patient ID: Laura Jenkins, female   DOB: Sep 17, 1986, 29 y.o.   MRN: [id number] PPD # 1 SVD  S:  Reports feeling well             Tolerating po/ No nausea or vomiting             Bleeding is light             Pain controlled with ibuprofen (OTC)             Up ad lib / ambulatory / voiding without difficulties    Newborn  Information for the patient's newborn:  Laura Jenkins, Boy Laura Jenkins [[id number]][[id number]]  female  breast feeding  / Circumcision planning   O:  A & O x 3, in no apparent distress              VS:  Filed Vitals:   09/27/15 0800 09/27/15 1643 09/28/15 0631 09/28/15 1015  BP: 136/61 132/65 154/91 119/71  Pulse: 101 102 103 88  Temp: 98 F (36.7 C) 98.4 F (36.9 C) 98.4 F (36.9 C) 98 F (36.7 C)  TempSrc: Oral Oral Oral Oral  Resp: 20 18 18 18   Height:      Weight:      SpO2:   99%     LABS:  Recent Labs  09/26/15 1800 09/28/15 0505  WBC 16.2* 17.4*  HGB 11.0* 9.5*  HCT 33.4* 28.8*  PLT 312 343    Blood type: O POS (11/29 2125)  Rubella: Immune (06/13 0000)   I&O: I/O last 3 completed shifts: In: -  Out: 200 [Urine:50; Blood:150]             Lungs: Clear and unlabored  Heart: regular rate and rhythm / no murmurs  Abdomen: soft, non-tender, non-distended             Fundus: firm, non-tender, U-1  Perineum: 1st degree repair healing well  Lochia: minimal  Extremities: No edema, no calf pain or tenderness, No Homans    A/P: PPD # 1  29 y.o., [id number]   Principal Problem:    Postpartum care following vaginal delivery (11/30)  Active Problems:    Transient hypertension of pregnancy, postpartum   Doing well - stable status  Routine post partum orders  Anticipate discharge tomorrow    Laura Jenkins, Laura Jenkins, M, MSN, CNM 09/28/2015, 12:43 PM

## 2015-09-29 DIAGNOSIS — D649 Anemia, unspecified: Secondary | ICD-10-CM | POA: Diagnosis not present

## 2015-09-29 MED ORDER — POLYSACCHARIDE IRON COMPLEX 150 MG PO CAPS: 150.0000 mg | ORAL_CAPSULE | Freq: Every day | ORAL | Status: DC

## 2015-09-29 MED ORDER — OXYCODONE-ACETAMINOPHEN 5-325 MG PO TABS: 1.0000 | ORAL_TABLET | ORAL | Status: DC | PRN

## 2015-09-29 MED ORDER — IBUPROFEN 600 MG PO TABS: 600.0000 mg | ORAL_TABLET | Freq: Four times a day (QID) | ORAL | Status: DC

## 2015-09-29 MED ORDER — POLYSACCHARIDE IRON COMPLEX 150 MG PO CAPS
150.0000 mg | ORAL_CAPSULE | Freq: Every day | ORAL | Status: DC
  Administered 2015-09-29: 150 mg via ORAL
  Filled 2015-09-29: qty 1
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
## 2015-09-29 NOTE — Progress Notes (Signed)
PPD #2- SVD  Subjective:   Reports feeling well, ready for discharge Tolerating po/ No nausea or vomiting Bleeding light Pain controlled with Motrin and Percocet Up ad lib / ambulatory / voiding without problems Newborn: breastfeeding  / Circumcision: done  Objective:   VS: VS:  Filed Vitals:   09/28/15 0631 09/28/15 1015 09/28/15 1855 09/29/15 0607  BP: 154/91 119/71 130/74 109/55  Pulse: 103 88 95 89  Temp: 98.4 F (36.9 C) 98 F (36.7 C) 98.1 F (36.7 C) 97.8 F (36.6 C)  TempSrc: Oral Oral Oral Oral  Resp: 18 18 18 18   Height:      Weight:      SpO2: 99%       LABS:  Recent Labs  09/26/15 1800 09/28/15 0505  WBC 16.2* 17.4*  HGB 11.0* 9.5*  PLT 312 343   Blood type: --/--/O POS, O POS (11/29 2125) Rubella: Immune (06/13 0000)                I&O: Intake/Output    None     Physical Exam: Alert and oriented X3 Abdomen: soft, non-tender, non-distended  Fundus: firm, non-tender, U-2 Perineum: Well approximated, no significant erythema, edema, or drainage; healing well. Lochia: small Extremities: No edema, no calf pain or tenderness   Assessment: PPD #2  G2P1011/ S/P:spontaneous vaginal, 1st degree and labial laceration Mild anemia Doing well - stable for discharge home  Plan: Discharge home RX's:  Ibuprofen 600mg  po Q 6 hrs prn pain #30 Refill x 0 Niferex 150mg  po QD #30 Refill x 1 Percocet 5/325 1 po Q 4 hrs prn pain #30 Refill x 0 Follow up in 6 wks at [facility] for postpartum visit [facility] booklet given    Laura Jenkins, Laura Jenkins, N MSN, CNM 09/29/2015, 5:19 PM

## 2015-09-29 NOTE — Discharge Summary (Signed)
Obstetric Discharge Summary Reason for Admission: induction of labor and [redacted] weeks gestation, transient HTN Prenatal Procedures: transient HTN Intrapartum Procedures: spontaneous vaginal delivery and AROM, Pitocin, epidural Postpartum Procedures: none Complications-Operative and Postpartum: 1st degree perineal laceration, labial laceration HEMOGLOBIN  Date Value Ref Range Status  09/28/2015 9.5* 12.0 - 15.0 g/dL Final  19/14/782902/19/2013 56.213.0 12.2 - 16.2 g/dL Final   HCT  Date Value Ref Range Status  09/28/2015 28.8* 36.0 - 46.0 % Final   HCT, POC  Date Value Ref Range Status  12/17/2011 40.4 37.7 - 47.9 % Final    Physical Exam:  General: alert, cooperative and no distress Lochia: appropriate Uterine Fundus: firm Incision: healing well, no significant drainage, no dehiscence, no significant erythema DVT Evaluation: No evidence of DVT seen on physical exam. Negative Homan's sign. No cords or calf tenderness. No significant calf/ankle edema.  Discharge Diagnoses: Term Pregnancy-delivered  Discharge Information: Date: 09/29/2015 Activity: pelvic rest Diet: Routine Medications: PNV, Ibuprofen and Percocet, Niferex Condition: stable Instructions: refer to practice specific booklet Discharge to: home Follow-up Information    Follow up with MODY,VAISHALI R, MD. Schedule an appointment as soon as possible for a visit in 6 weeks.   Specialty:  Obstetrics and Gynecology   Contact information:   [address]is Gash[address] [place] [address] [phone]       Newborn Data: Live born female  Birth Weight: 7 lb 3.9 oz (3285 g) APGAR: 6, 7  Home with mother.  Faithe Ariola, N 09/29/2015, 5:23 PM

## 2015-09-29 NOTE — Lactation Note (Signed)
This note was copied from the chart of Laura Jenkins. Lactation Consultation Note  Patient Name: Laura Jenkins ZOXWR'UToday's Date: 09/29/2015 Reason for consult: Follow-up assessment (5% weight loss ) Baby is 4054 hours old and [facility]. Yesterday ,  10 -30 mins x 6 , Latch scores - 9-9-7-8-7-8-8-9 . Bili check at 44 hours old 8.8.  Baby came back form a circ and awake and hungry. Dad and mom working together has a team , Dad  Checked diaper , undressed baby and mom pre-pumped with hand pump , with good flow. , Mom excited the milk is transitioning and increased flow also mentioned the shells are working well.  Mom has found comfort sitting in the chair to breast feed and using the boppy pillow wrapped around her side.  Mom positions the baby well and latched with depth. LC checked lip line and positioned baby's hips back further  To obtain better depth and nose clearance. Per mom comfortable. Baby fed 10 mins , released , acted satisfied,  and nipple well rounded.  LC reviewed basics of latching, ( which mom has a good understanding of ), prevention of sore nipples and engorgement.  Due to edema , and challenges with latch in the 1st 24 hours, mom recommended renting a DEBP for a strong B , instil  Moms insurance pump comes in . Mom started out with hand pump. LC instructed mom and dad how to set up  the DEBP  at home , and cleaning pump pieces.  Also a Nipple Shield and hasn't had to use NS in the last 24 hours.  LC praised mom for her efforts breast feeding and dad for his support.  Mother informed of post-discharge support and given phone number to the lactation department, including services for phone  call assistance; out-patient appointments; and breastfeeding support group. List of other breastfeeding resources in the community  given in the handout. Encouraged mother to call for problems or concerns related to  breastfeeding.   Maternal Data Has patient been taught Hand Expression?: Yes  Feeding Feeding Type: Breast Fed Length of feed: 10 min (multiply swallows and gulps noted )  LATCH Score/Interventions Latch: Grasps breast easily, tongue down, lips flanged, rhythmical sucking. Intervention(s): Adjust position;Assist with latch;Breast massage;Breast compression  Audible Swallowing: Spontaneous and intermittent  Type of Nipple: Everted at rest and after stimulation  Comfort (Breast/Nipple): Soft / non-tender     Hold (Positioning): Assistance needed to correctly position infant at breast and maintain latch. (mom needed minminum assist ) Intervention(s): Breastfeeding basics reviewed;Support Pillows;Position options;Skin to skin  LATCH Score: 9  Lactation Tools Discussed/Used Tools: Shells;Pump Shell Type: Inverted Breast pump type: Double-Electric Breast Pump WIC Program: No Pump Review: Setup, frequency, and cleaning Initiated by:: MAI  Date initiated:: 09/29/15   Consult Status Consult Status: Complete Date: 09/29/15 Follow-up type: In-patient    Kathrin Greathouseorio, Laura Jenkins 09/29/2015, 11:17 AM

## 2015-12-11 ENCOUNTER — Ambulatory Visit (HOSPITAL_COMMUNITY)
Admission: RE | Admit: 2015-12-11 | Discharge: 2015-12-11 | Disposition: A | Discharge status: Home / Self Care | Payer: Medicaid Other | Source: Ambulatory Visit | Attending: Obstetrics & Gynecology | Admitting: Obstetrics & Gynecology

## 2015-12-11 NOTE — Lactation Note (Signed)
Lactation Consult  Mother's reason for visit:  Laura Jenkins is here today because she is considering changing to formula.  She decided to come for an appointment because she really does not want to stop BF and reports that when a feeding is successful she really enjoys BF.  She recently has been pumping and bottle feeding (2 x a day) Because pumping is a hassle, the nipple shield falls off and  it is difficult to latch and maintain the latch with Laura Jenkins she has become frustrated.  Laura Jenkins is very stiff when he is handled and it is awkward to position him at the breast for feedings. Today he latched on the right breast with a #24 nipple shield but did not maintain the latch.  Laid back nursing was then tried and he settled in, pulled the shield deeply into his mouth and fed rhythmically until the flow decreased on the left breast.  He was removed and weighed. The total transfer was 34 ml. He was placed in the laid back position to feed on the right breast. When he was well positioned he easily attached with the #24 nipple shield fed and transferred 64 ml. He was still hungry so he was repositioned on the first side. His total transfer was 112 ml. Though he was relaxed after the feed his tone continued to be high.  Oral evaluation sensitive to upper lip flanging, lateralizes well, tongue tethered to the floor of the mouth with crying. Tongue not observed extending over the lips when baby is attached. Manual tongue elevation reveals frenum.  Mother given resources to learn about tongue-tie.  Suggested she take a bath with Laura Jenkins to see if the water is relaxing. Also suggested massage, chiropractic or PT.  Visit Type:  OP  Lactation Consultant:  Laura Jenkins  ________________________________________________________________________ Laura Jenkins Name: Laura Jenkins Date of Birth: 09/27/2015 Pediatrician: Laura Jenkins Gender: female Gestational Age: [redacted]w[redacted]d (At Birth) Birth Weight: 7 lb 3.9 oz (3285  g) Weight at Discharge: Weight: 6 lb 13.5 oz (3105 g)Date of Discharge: 09/29/2015 Filed Weights   09/27/15 0415 09/28/15 0000 09/29/15 0111  Weight: 7 lb 3.9 oz (3285 g) 7 lb 0.9 oz (3200 g) 6 lb 13.5 oz (3105 g)   [facility] 11# 2 oz Location:Pediatrician's office Weight today: 11 # 14 oz        ________________________________________________________________________  Mother's Name: Laura Jenkins  Breastfeeding Experience:  Frustrating thus far ________________________________________________________________________  Breastfeeding History (Post Discharge)  Frequency of breastfeeding:  Feeding expressed BM via bottle    Pumping  Type of pump:  Medela pump in style and Spectra Frequency:  Twice a day Volume:  2-9 oz  Infant Intake and Output Assessment  Voids:  10+ in 24 hrs.  Color:  Clear yellow Stools:  2-3 in 24 hrs.    ________________________________________________________________________

## 2016-01-01 ENCOUNTER — Ambulatory Visit (HOSPITAL_COMMUNITY)
Admission: RE | Admit: 2016-01-01 | Discharge: 2016-01-01 | Disposition: A | Discharge status: Home / Self Care | Payer: Medicaid Other | Source: Ambulatory Visit | Attending: Obstetrics & Gynecology | Admitting: Obstetrics & Gynecology

## 2016-01-01 NOTE — Lactation Note (Addendum)
Lactation Consult  Mother's reason for visit:  Follow-up post tongue tie revision Appointment Notes:  Laura Jenkins is here today to follow-up for a tongue and lip frenectomy.  She is much more relaxed and reports that Laura Jenkins is feeding much better.  His body posture is also becoming more relaxed.  Today he pulled the breast deeply into his mouth and transferred 86 ml in about 15 min. Mom reports that the feeding duration has decreased significantly. She is using a NS to latch him but we talked about strategies to wean him.  She also reports that his gassiness and reflux has essentially disappeared. Mom was shown how to do tongue exercises to help him lateralize. She has taken him for massage and reports that it is helping him relax.  Follow-up prn.  Lactation Consultant:  Soyla DryerJoseph, Alexina Niccoli  ________________________________________________________________________ Joan FloresBaby's Name: Laura Jenkins Date of Birth: 09/27/2015 Pediatrician: Vonna KotykeClaire Gender: female Gestational Age: 2084w2d (At Birth) Birth Weight: 7 lb 3.9 oz (3285 g) Weight at Discharge: Weight: 6 lb 13.5 oz (3105 g)Date of Discharge: 09/29/2015 [facility]   09/27/15 0415 09/28/15 0000 09/29/15 0111  Weight: 7 lb 3.9 oz (3285 g) 7 lb 0.9 oz (3200 g) 6 lb 13.5 oz (3105 g)   Weight today: 13#2.4 oz. Weight has increased 20 oz in 21 days!      ________________________________________________________________________  Mother's Name: Laura Jenkins  Frequency of breastfeeding:  5-6 times in 24 hours Duration of feeding:  25 minutes  Mom pumps 4-6 oz 2 x a day and bottle feeds Laura Jenkins 2 bottles of formula at night. Recommended using expressed breast milk to feed him.  Infant Intake and Output Assessment  Voids:  8 in 24 hrs.  Color:  Clear yellow Stools:  2 in 24 hrs.  Color:  Yellow,orange  ________________________________________________________________________
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
## 2016-10-28 NOTE — L&D Delivery Note (Signed)
Delivery Note Pt pushed well about 30 minutes and at 4:27 PM a healthy female was delivered via Vaginal, Spontaneous (Presentation: ROA ).  APGAR: 8,9 ; weight pending .   Placenta status: delivred spontaneously.  Cord:  with the following complications:none .   Anesthesia:  epidural Episiotomy: None Lacerations: 1st degree vaginal abrasion  Suture Repair: 3.0 vicryl rapide Est. Blood Loss (mL): 175  Mom to postpartum.  Baby to Couplet care / Skin to Skin. D/w pt circumcision and she desires in hospital.  Has paid at office, aware needs to pay hospital before can proceed.  Oliver PilaKathy W Pualani Borah 09/19/2017, 4:42 PM

## 2017-02-24 LAB — OB RESULTS CONSOLE HIV ANTIBODY (ROUTINE TESTING): HIV: NONREACTIVE

## 2017-02-24 LAB — OB RESULTS CONSOLE ABO/RH: RH Type: POSITIVE

## 2017-02-24 LAB — OB RESULTS CONSOLE RUBELLA ANTIBODY, IGM: Rubella: IMMUNE

## 2017-02-24 LAB — OB RESULTS CONSOLE HEPATITIS B SURFACE ANTIGEN: Hepatitis B Surface Ag: NEGATIVE

## 2017-02-24 LAB — OB RESULTS CONSOLE GC/CHLAMYDIA
Chlamydia: NEGATIVE
GC PROBE AMP, GENITAL: NEGATIVE

## 2017-02-24 LAB — OB RESULTS CONSOLE RPR: RPR: NONREACTIVE

## 2017-02-24 LAB — OB RESULTS CONSOLE ANTIBODY SCREEN: Antibody Screen: NEGATIVE

## 2017-06-10 ENCOUNTER — Encounter: Payer: Self-pay | Admitting: Dietician

## 2017-06-10 ENCOUNTER — Encounter: Payer: Medicaid Other | Attending: Obstetrics and Gynecology | Admitting: Dietician

## 2017-06-10 DIAGNOSIS — R635 Abnormal weight gain: Secondary | ICD-10-CM | POA: Diagnosis present

## 2017-06-10 DIAGNOSIS — Z713 Dietary counseling and surveillance: Secondary | ICD-10-CM | POA: Diagnosis not present

## 2017-06-10 DIAGNOSIS — O2602 Excessive weight gain in pregnancy, second trimester: Secondary | ICD-10-CM

## 2017-06-10 NOTE — Progress Notes (Signed)
  Medical Nutrition Therapy:  Appt start time: 0945 end time:  1045.   Assessment:  Primary concerns today: Patient is here today alone.  She has been referred for excessive weight gain during pregnancy.  She would like to learn how to avoid excessive weight.  She goes for her glucose tolerance test next week.  Weight hx: 202 lbs today ([redacted] weeks gestation) 175 lbs prior to pregnancy. She has a 1121 month old and gained 40 lbs during that pregnancy, lost 20 lbs after birth, was unable to breastfeed and pumped and stated that she ate a lot as she was very hungry. She then lost 33 lbs with trying very hard (My plate and exercise).   She lives with her husband and 6821 month old son.  She stays at home with him and does the majority of the cooking and shopping.  Currently she has been very tired and does not feel like cooking.  She has started to be more mindful and changed her eating habits some prior to this visit.  Preferred Learning Style:   No preference indicated   Learning Readiness:   Ready  Change in progress   MEDICATIONS: see list   DIETARY INTAKE: Has not felt like looking recently. Usual eating pattern includes 3 meals and 2 snacks per day.  24-hr recall:  B (7:30-8 AM): cereal (prefers sweetened) and milk OR oatmeal and coffee  Snk ( AM): none  L (11:30-12 PM): [employer] or [employer] OR PB&J or Roast beef sandwich and chips OR leftovers Snk ( PM): cereal and whole milk or unsweetened almond milk D ( PM): pasta with meat sauce OR refried beans, eggs, cherizo OR tamales and rice Snk ( PM): ice cream (1/2 cup) Beverages: water with crystal lite mix, clear american sparkling water, diet coke, coffee with cream and 4 tsp sugar  Usual physical activity: none    Progress Towards Goal(s):  In progress.   Nutritional Diagnosis:  NB-1.1 Food and nutrition-related knowledge deficit As related to healthy eating during pregnancy.  As evidenced by patient report.    Intervention:   Nutrition counseling/education related to mindful eating and basic recommendations for nutrition during pregnancy.    Stay active.  Walking is generally fine.  Discuss any limits or concerns with your doctor. Increase your vegetable intake. Make half your plate non starchy vegetables. Choose lean meat, remove the skin from the chicken, bake rather than fry. Consider limiting those foods with calories but limited nutrition such as chips and sugar. Have small snacks throughout the day with protein and carbohydrate to avoid getting extra hungry for meals. Eat slowly.  Be aware of when you are satisfied. Consider eating at the table without distractions.  Teaching Method Utilized:  Visual Auditory Hands on  Handouts given during visit include:  My plate  Meal plan card  Nutrition during pregnancy from AND  Barriers to learning/adherence to lifestyle change: tired  Demonstrated degree of understanding via:  Teach Back   Monitoring/Evaluation:  Dietary intake, exercise, and body weight prn.

## 2017-06-10 NOTE — Patient Instructions (Signed)
Stay active.  Walking is generally fine.  Discuss any limits or concerns with your doctor. Increase your vegetable intake. Make half your plate non starchy vegetables. Choose lean meat, remove the skin from the chicken, bake rather than fry. Consider limiting those foods with calories but limited nutrition such as chips and sugar. Have small snacks throughout the day with protein and carbohydrate to avoid getting extra hungry for meals. Eat slowly.  Be aware of when you are satisfied. Consider eating at the table without distractions.

## 2017-08-25 LAB — OB RESULTS CONSOLE GBS: STREP GROUP B AG: NEGATIVE

## 2017-09-05 ENCOUNTER — Telehealth (HOSPITAL_COMMUNITY): Payer: Self-pay | Admitting: *Deleted

## 2017-09-05 ENCOUNTER — Encounter (HOSPITAL_COMMUNITY): Payer: Self-pay | Admitting: *Deleted

## 2017-09-05 NOTE — Telephone Encounter (Signed)
Preadmission screen  

## 2017-09-14 ENCOUNTER — Encounter (HOSPITAL_COMMUNITY): Payer: Self-pay

## 2017-09-14 ENCOUNTER — Inpatient Hospital Stay (EMERGENCY_DEPARTMENT_HOSPITAL)
Admission: AD | Admit: 2017-09-14 | Discharge: 2017-09-14 | Disposition: A | Discharge status: Home / Self Care | Payer: Medicaid Other | Source: Ambulatory Visit | Attending: Obstetrics and Gynecology | Admitting: Obstetrics and Gynecology

## 2017-09-14 DIAGNOSIS — O368130 Decreased fetal movements, third trimester, not applicable or unspecified: Secondary | ICD-10-CM | POA: Diagnosis not present

## 2017-09-14 DIAGNOSIS — Z3689 Encounter for other specified antenatal screening: Secondary | ICD-10-CM

## 2017-09-14 DIAGNOSIS — Z3A38 38 weeks gestation of pregnancy: Secondary | ICD-10-CM

## 2017-09-14 HISTORY — DX: Other seasonal allergic rhinitis: J30.2

## 2017-09-14 NOTE — MAU Provider Note (Signed)
History     CSN: [id number]  Arrival date and time: 09/14/17 1142   First Provider Initiated Contact with Patient 09/14/17 1224     Chief Complaint  Patient presents with  . Decreased Fetal Movement   G3P1011 @38 .4 wks here with decreased FM. She reports only feeling one movement this am which is not usual for her baby. Denies VB, LOF, or ctx. Her pregnancy has been uncomplicated however she has hx of anxiety and has not used medications during the pregnancy. She reports "just getting through it" as her way to cope with anxiety.   OB History    Gravida Para Term Preterm AB Living   3 1 1   1 1    SAB TAB Ectopic Multiple Live Births         0 1      Past Medical History:  Diagnosis Date  . Anxiety   . Asthma   . Depression   . Indication for care in labor or delivery 09/26/2015  . Medical history non-contributory   . Seasonal allergies     Past Surgical History:  Procedure Laterality Date  . EYE SURGERY    . STRABISMUS SURGERY  1989    Family History  Problem Relation Age of Onset  . Asthma Mother   . Gout Father   . Asthma Sister   . Miscarriages / Stillbirths Sister   . Asthma Sister   . Cancer Maternal Grandmother   . COPD Maternal Grandmother   . Cancer Paternal Grandmother   . COPD Paternal Grandmother   . Alcohol abuse Neg Hx   . Arthritis Neg Hx   . Birth defects Neg Hx   . Depression Neg Hx   . Diabetes Neg Hx   . Drug abuse Neg Hx   . Early death Neg Hx   . Hearing loss Neg Hx   . Heart disease Neg Hx   . Hyperlipidemia Neg Hx   . Hypertension Neg Hx   . Kidney disease Neg Hx   . Learning disabilities Neg Hx   . Mental illness Neg Hx   . Mental retardation Neg Hx   . Stroke Neg Hx   . Vision loss Neg Hx   . Varicose Veins Neg Hx     Social History   Tobacco Use  . Smoking status: Former Smoker    Last attempt to quit: 03/05/2009    Years since quitting: 8.5  . Smokeless tobacco: Never Used  Substance Use Topics  . Alcohol use: No   . Drug use: No    Allergies:  Allergies  Allergen Reactions  . Phentermine     hypertension    Medications Prior to Admission  Medication Sig Dispense Refill Last Dose  . albuterol (PROVENTIL HFA;VENTOLIN HFA) 108 (90 Base) MCG/ACT inhaler Inhale 1-2 puffs into the lungs every 6 (six) hours as needed for wheezing or shortness of breath.   Taking  . cetirizine (ZYRTEC) 10 MG tablet Take 10 mg by mouth daily.   Taking  . ibuprofen (ADVIL,MOTRIN) 600 MG tablet Take 1 tablet (600 mg total) by mouth every 6 (six) hours. 30 tablet 0 Taking  . iron polysaccharides (NIFEREX) 150 MG capsule Take 1 capsule (150 mg total) by mouth daily. (Patient not taking: Reported on 06/10/2017) 30 capsule 0 Not Taking  . loratadine (CLARITIN) 10 MG tablet Take 10 mg by mouth daily.   Taking  . oxyCODONE-acetaminophen (PERCOCET/ROXICET) 5-325 MG tablet Take 1 tablet by mouth every 4 (four)  hours as needed (for pain scale 4-7). (Patient not taking: Reported on 06/10/2017) 30 tablet 0 Not Taking  . Prenatal Vit-Min-FA-Fish Oil ([employer] PRENATAL GUMMY PO) Take 2 each by mouth daily.   Taking    Review of Systems  Gastrointestinal: Negative for abdominal pain.  Genitourinary: Negative for vaginal bleeding.   Physical Exam   Blood pressure 138/81, pulse (!) 137, temperature 97.7 F (36.5 C), resp. rate 16, height 5\' 4"  (1.626 m), weight 216 lb 8 oz (98.2 kg), SpO2 98 %, unknown if currently breastfeeding.  Physical Exam  Constitutional: She is oriented to person, place, and time. She appears well-developed and well-nourished. No distress.  HENT:  Head: Normocephalic.  Neck: Normal range of motion.  Respiratory: Effort normal. No respiratory distress.  GI: Soft. She exhibits no distension. There is no tenderness.  gravid  Musculoskeletal: Normal range of motion.  Neurological: She is alert and oriented to person, place, and time.  Skin: Skin is warm and dry.  Psychiatric: She has a normal mood and affect.   EFM: 155 bpm, mod variability, + accels, no decels Toco: rare  MAU Course  Procedures  MDM Pt reports +FM since EFM applied. FM audible. Maternal HR and FHR initially tachy but improved to normal baseline. Pt reassured. Presentation, clinical findings, and plan discussed with Dr. Ellyn HackBovard. Stable for discharge home.  Assessment and Plan   1. [redacted] weeks gestation of pregnancy   2. Decreased fetal movements in third trimester, single or unspecified fetus   3. NST (non-stress test) reactive    Discharge home Follow up in OB office in 2 days as scheduled [facility] precautions  Allergies as of 09/14/2017      Reactions   Phentermine    hypertension      Medication List    STOP taking these medications   ibuprofen 600 MG tablet Commonly known as:  ADVIL,MOTRIN   iron polysaccharides 150 MG capsule Commonly known as:  NIFEREX   oxyCODONE-acetaminophen 5-325 MG tablet Commonly known as:  PERCOCET/ROXICET     TAKE these medications   albuterol 108 (90 Base) MCG/ACT inhaler Commonly known as:  PROVENTIL HFA;VENTOLIN HFA Inhale 1-2 puffs into the lungs every 6 (six) hours as needed for wheezing or shortness of breath.   cetirizine 10 MG tablet Commonly known as:  ZYRTEC Take 10 mg by mouth daily.   [employer] PRENATAL GUMMY PO Take 2 each by mouth daily.   loratadine 10 MG tablet Commonly known as:  CLARITIN Take 10 mg by mouth daily.      Donette LarryMelanie Kenlea Woodell, CNM 09/14/2017, 12:32 PM

## 2017-09-14 NOTE — MAU Note (Signed)
Has only felt movement once today.  No bleeding or leaking.  Few Braxton Hicks this morning, nothing painful.  Denies problems with preg.

## 2017-09-14 NOTE — Discharge Instructions (Signed)
Braxton Hicks Contractions °Contractions of the uterus can occur throughout pregnancy, but they are not always a sign that you are in labor. You may have practice contractions called Braxton Hicks contractions. These false labor contractions are sometimes confused with true labor. °What are Braxton Hicks contractions? °Braxton Hicks contractions are tightening movements that occur in the muscles of the uterus before labor. Unlike true labor contractions, these contractions do not result in opening (dilation) and thinning of the cervix. Toward the end of pregnancy (32-34 weeks), Braxton Hicks contractions can happen more often and may become stronger. These contractions are sometimes difficult to tell apart from true labor because they can be very uncomfortable. You should not feel embarrassed if you go to the hospital with false labor. °Sometimes, the only way to tell if you are in true labor is for your health care provider to look for changes in the cervix. The health care provider will do a physical exam and may monitor your contractions. If you are not in true labor, the exam should show that your cervix is not dilating and your water has not broken. °If there are no prenatal problems or other health problems associated with your pregnancy, it is completely safe for you to be sent home with false labor. You may continue to have Braxton Hicks contractions until you go into true labor. °How can I tell the difference between true labor and false labor? °· Differences °? False labor °? Contractions last 30-70 seconds.: Contractions are usually shorter and not as strong as true labor contractions. °? Contractions become very regular.: Contractions are usually irregular. °? Discomfort is usually felt in the top of the uterus, and it spreads to the lower abdomen and low back.: Contractions are often felt in the front of the lower abdomen and in the groin. °? Contractions do not go away with walking.: Contractions may  go away when you walk around or change positions while lying down. °? Contractions usually become more intense and increase in frequency.: Contractions get weaker and are shorter-lasting as time goes on. °? The cervix dilates and gets thinner.: The cervix usually does not dilate or become thin. °Follow these instructions at home: °· Take over-the-counter and prescription medicines only as told by your health care provider. °· Keep up with your usual exercises and follow other instructions from your health care provider. °· Eat and drink lightly if you think you are going into labor. °· If Braxton Hicks contractions are making you uncomfortable: °? Change your position from lying down or resting to walking, or change from walking to resting. °? Sit and rest in a tub of warm water. °? Drink enough fluid to keep your urine clear or pale yellow. Dehydration may cause these contractions. °? Do slow and deep breathing several times an hour. °· Keep all follow-up prenatal visits as told by your health care provider. This is important. °Contact a health care provider if: °· You have a fever. °· You have continuous pain in your abdomen. °Get help right away if: °· Your contractions become stronger, more regular, and closer together. °· You have fluid leaking or gushing from your vagina. °· You pass blood-tinged mucus (bloody show). °· You have bleeding from your vagina. °· You have low back pain that you never had before. °· You feel your baby’s head pushing down and causing pelvic pressure. °· Your baby is not moving inside you as much as it used to. °Summary °· Contractions that occur before labor are   called Braxton Hicks contractions, false labor, or practice contractions. °· Braxton Hicks contractions are usually shorter, weaker, farther apart, and less regular than true labor contractions. True labor contractions usually become progressively stronger and regular and they become more frequent. °· Manage discomfort from  Braxton Hicks contractions by changing position, resting in a warm bath, drinking plenty of water, or practicing deep breathing. °This information is not intended to replace advice given to you by your health care provider. Make sure you discuss any questions you have with your health care provider. °Document Released: 10/14/2005 Document Revised: 09/02/2016 Document Reviewed: 09/02/2016 °Elsevier Interactive Patient Education © 2017 [employer] ° ° °Fetal Movement Counts °Patient Name: ________________________________________________ Patient Due Date: ____________________ °What is a fetal movement count? °A fetal movement count is the number of times that you feel your baby move during a certain amount of time. This may also be called a fetal kick count. A fetal movement count is recommended for every pregnant woman. You may be asked to start counting fetal movements as early as week 28 of your pregnancy. °Pay attention to when your baby is most active. You may notice your baby's sleep and wake cycles. You may also notice things that make your baby move more. You should do a fetal movement count: °· When your baby is normally most active. °· At the same time each day. ° °A good time to count movements is while you are resting, after having something to eat and drink. °How do I count fetal movements? °1. Find a quiet, comfortable area. Sit, or lie down on your side. °2. Write down the date, the start time and stop time, and the number of movements that you felt between those two times. Take this information with you to your health care visits. °3. For 2 hours, count kicks, flutters, swishes, rolls, and jabs. You should feel at least 10 movements during 2 hours. °4. You may stop counting after you have felt 10 movements. °5. If you do not feel 10 movements in 2 hours, have something to eat and drink. Then, keep resting and counting for 1 hour. If you feel at least 4 movements during that hour, you may stop  counting. °Contact a health care provider if: °· You feel fewer than 4 movements in 2 hours. °· Your baby is not moving like he or she usually does. °Date: ____________ Start time: ____________ Stop time: ____________ Movements: ____________ °Date: ____________ Start time: ____________ Stop time: ____________ Movements: ____________ °Date: ____________ Start time: ____________ Stop time: ____________ Movements: ____________ °Date: ____________ Start time: ____________ Stop time: ____________ Movements: ____________ °Date: ____________ Start time: ____________ Stop time: ____________ Movements: ____________ °Date: ____________ Start time: ____________ Stop time: ____________ Movements: ____________ °Date: ____________ Start time: ____________ Stop time: ____________ Movements: ____________ °Date: ____________ Start time: ____________ Stop time: ____________ Movements: ____________ °Date: ____________ Start time: ____________ Stop time: ____________ Movements: ____________ °This information is not intended to replace advice given to you by your health care provider. Make sure you discuss any questions you have with your health care provider. °Document Released: 11/13/2006 Document Revised: 06/12/2016 Document Reviewed: 11/23/2015 °Elsevier Interactive Patient Education © 2018 [employer] ° °

## 2017-09-18 NOTE — H&P (Signed)
Laura Jenkins is a 31 y.o. female G3P1011 at 4339 2/7 weeks (EDD 09/24/17 by LMP c/w 9 week US) presenting for IOL with favorable cervix at term.  Prenatal care complicated by asthma, stable on flovent, and h/o depression, stable off buspar this pregnancy--plans to resume postpartum.   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  OB History    Gravida Para Term Preterm AB Living   3 1 1   1 1    SAB TAB Ectopic Multiple Live Births         0 1    2016 NSVD 7#4oz   Past Medical History:  Diagnosis Date  . Anxiety   . Asthma   . Depression   . Indication for care in labor or delivery 09/26/2015  . Medical history non-contributory   . Seasonal allergies    Past Surgical History:  Procedure Laterality Date  . EYE SURGERY    . STRABISMUS SURGERY  1989   Family History: family history includes Asthma in her mother, sister, and sister; COPD in her maternal grandmother and paternal grandmother; Cancer in her maternal grandmother and paternal grandmother; Gout in her father; Miscarriages / [place] in her sister. Social History:  reports that she quit smoking about 8 years ago. she has never used smokeless tobacco. She reports that she does not drink alcohol or use drugs.     Maternal Diabetes: No Genetic Screening: Normal Maternal Ultrasounds/Referrals: Normal Fetal Ultrasounds or other Referrals:  None Maternal Substance Abuse:  No Significant Maternal Medications:  Meds include: Other: Valtrex Significant Maternal Lab Results:  Lab values include: Other:   CF possitive carrier, FOB negative Other Comments:  None  Review of Systems  Gastrointestinal: Negative for abdominal pain.  Neurological: Negative for headaches.   Maternal Medical History:  Reason for admission: Contractions.   Contractions: Frequency: irregular.   Perceived severity is mild.    Fetal  activity: Perceived fetal activity is normal.    Prenatal Complications - Diabetes: none.      unknown if currently breastfeeding. Maternal Exam:  Uterine Assessment: Contraction strength is mild.  Contraction frequency is irregular.   Abdomen: Patient reports no abdominal tenderness. Fetal presentation: vertex  Introitus: Normal vulva. Normal vagina.    Physical Exam  Constitutional: She appears well-developed.  Cardiovascular: Normal rate.  Respiratory: Effort normal.  GI: Soft.  Genitourinary: Vagina normal.  Neurological: She is alert.  Psychiatric: She has a normal mood and affect.    Prenatal labs: ABO, Rh: O/Positive/-- (04/30 0000) Antibody: Negative (04/30 0000) Rubella: Immune (04/30 0000) RPR: Nonreactive (04/30 0000)  HBsAg: Negative (04/30 0000)  HIV: Non-reactive (04/30 0000)  GBS: Negative (10/29 0000)  First trimester screen  And AFP negative One hour GCT 79  Assessment/Plan: Pt for IOL at term.  Plan AROM and Pitocin   Oliver PilaKathy W Erva Koke 09/18/2017, 9:15 PM

## 2017-09-19 ENCOUNTER — Encounter (HOSPITAL_COMMUNITY): Payer: Self-pay | Admitting: Anesthesiology

## 2017-09-19 ENCOUNTER — Inpatient Hospital Stay (HOSPITAL_COMMUNITY): Payer: Medicaid Other | Admitting: Anesthesiology

## 2017-09-19 ENCOUNTER — Inpatient Hospital Stay (HOSPITAL_COMMUNITY)
Admission: RE | Admit: 2017-09-19 | Discharge: 2017-09-21 | DRG: 807 | Disposition: A | Discharge status: Home / Self Care | Payer: Medicaid Other | Source: Ambulatory Visit | Attending: Obstetrics and Gynecology | Admitting: Obstetrics and Gynecology

## 2017-09-19 ENCOUNTER — Other Ambulatory Visit: Payer: Self-pay

## 2017-09-19 ENCOUNTER — Encounter (HOSPITAL_COMMUNITY): Payer: Self-pay

## 2017-09-19 DIAGNOSIS — D649 Anemia, unspecified: Secondary | ICD-10-CM | POA: Diagnosis present

## 2017-09-19 DIAGNOSIS — J45909 Unspecified asthma, uncomplicated: Secondary | ICD-10-CM | POA: Diagnosis present

## 2017-09-19 DIAGNOSIS — O9832 Other infections with a predominantly sexual mode of transmission complicating childbirth: Secondary | ICD-10-CM | POA: Diagnosis not present

## 2017-09-19 DIAGNOSIS — O9952 Diseases of the respiratory system complicating childbirth: Secondary | ICD-10-CM | POA: Diagnosis present

## 2017-09-19 DIAGNOSIS — Z141 Cystic fibrosis carrier: Secondary | ICD-10-CM | POA: Diagnosis not present

## 2017-09-19 DIAGNOSIS — Z3483 Encounter for supervision of other normal pregnancy, third trimester: Secondary | ICD-10-CM | POA: Diagnosis not present

## 2017-09-19 DIAGNOSIS — O9902 Anemia complicating childbirth: Secondary | ICD-10-CM | POA: Diagnosis present

## 2017-09-19 DIAGNOSIS — Z3A39 39 weeks gestation of pregnancy: Secondary | ICD-10-CM | POA: Diagnosis not present

## 2017-09-19 DIAGNOSIS — Z87891 Personal history of nicotine dependence: Secondary | ICD-10-CM

## 2017-09-19 DIAGNOSIS — A6000 Herpesviral infection of urogenital system, unspecified: Secondary | ICD-10-CM | POA: Diagnosis present

## 2017-09-19 LAB — RPR: RPR: NONREACTIVE

## 2017-09-19 LAB — COMPREHENSIVE METABOLIC PANEL
ALBUMIN: 2.5 g/dL — AB (ref 3.5–5.0)
ALT: 17 U/L (ref 14–54)
AST: 23 U/L (ref 15–41)
Alkaline Phosphatase: 173 U/L — ABNORMAL HIGH (ref 38–126)
Anion gap: 9 (ref 5–15)
CHLORIDE: 106 mmol/L (ref 101–111)
CO2: 20 mmol/L — AB (ref 22–32)
CREATININE: 0.61 mg/dL (ref 0.44–1.00)
Calcium: 8.8 mg/dL — ABNORMAL LOW (ref 8.9–10.3)
GFR calc Af Amer: >60 mL/min (ref >60)
GLUCOSE: 105 mg/dL — AB (ref 65–99)
POTASSIUM: 4 mmol/L (ref 3.5–5.1)
Sodium: 135 mmol/L (ref 135–145)
Total Bilirubin: 0.2 mg/dL — ABNORMAL LOW (ref 0.3–1.2)
Total Protein: 6.6 g/dL (ref 6.5–8.1)

## 2017-09-19 LAB — TYPE AND SCREEN
ABO/RH(D): O POS
Antibody Screen: NEGATIVE

## 2017-09-19 LAB — CBC
HCT: 34.4 % — ABNORMAL LOW (ref 36.0–46.0)
HEMOGLOBIN: 11 g/dL — AB (ref 12.0–15.0)
MCH: 26.5 pg (ref 26.0–34.0)
MCHC: 32 g/dL (ref 30.0–36.0)
MCV: 82.9 fL (ref 78.0–100.0)
Platelets: 367 10*3/uL (ref 150–400)
RBC: 4.15 MIL/uL (ref 3.87–5.11)
RDW: 14.5 % (ref 11.5–15.5)
WBC: 11 10*3/uL — ABNORMAL HIGH (ref 4.0–10.5)

## 2017-09-19 MED ORDER — WITCH HAZEL-GLYCERIN EX PADS: 1.0000 "application " | MEDICATED_PAD | CUTANEOUS | Status: DC | PRN

## 2017-09-19 MED ORDER — SENNOSIDES-DOCUSATE SODIUM 8.6-50 MG PO TABS
2.0000 | ORAL_TABLET | ORAL | Status: DC
  Administered 2017-09-19 – 2017-09-21 (×2): 2 via ORAL
  Filled 2017-09-19 (×2): qty 2

## 2017-09-19 MED ORDER — PRENATAL MULTIVITAMIN CH
1.0000 | ORAL_TABLET | Freq: Every day | ORAL | Status: DC
  Administered 2017-09-20 – 2017-09-21 (×2): 1 via ORAL
  Filled 2017-09-19 (×2): qty 1

## 2017-09-19 MED ORDER — ONDANSETRON HCL 4 MG/2ML IJ SOLN: 4.0000 mg | INTRAMUSCULAR | Status: DC | PRN

## 2017-09-19 MED ORDER — ONDANSETRON HCL 4 MG/2ML IJ SOLN: 4.0000 mg | Freq: Four times a day (QID) | INTRAMUSCULAR | Status: DC | PRN

## 2017-09-19 MED ORDER — FENTANYL 2.5 MCG/ML BUPIVACAINE 1/10 % EPIDURAL INFUSION (WH - ANES): 14.0000 mL/h | INTRAMUSCULAR | Status: DC | PRN

## 2017-09-19 MED ORDER — LIDOCAINE HCL (PF) 1 % IJ SOLN
30.0000 mL | INTRAMUSCULAR | Status: DC | PRN
  Filled 2017-09-19: qty 30

## 2017-09-19 MED ORDER — LACTATED RINGERS IV SOLN
INTRAVENOUS | Status: DC
  Administered 2017-09-19 (×3): via INTRAVENOUS

## 2017-09-19 MED ORDER — SOD CITRATE-CITRIC ACID 500-334 MG/5ML PO SOLN: 30.0000 mL | ORAL | Status: DC | PRN

## 2017-09-19 MED ORDER — DIPHENHYDRAMINE HCL 25 MG PO CAPS: 25.0000 mg | ORAL_CAPSULE | Freq: Four times a day (QID) | ORAL | Status: DC | PRN

## 2017-09-19 MED ORDER — LIDOCAINE HCL (PF) 1 % IJ SOLN
INTRAMUSCULAR | Status: DC | PRN
  Administered 2017-09-19 (×2): 4 mL

## 2017-09-19 MED ORDER — COCONUT OIL OIL
1.0000 "application " | TOPICAL_OIL | Status: DC | PRN
  Administered 2017-09-21: 1 via TOPICAL
  Filled 2017-09-19: qty 120

## 2017-09-19 MED ORDER — IBUPROFEN 600 MG PO TABS
600.0000 mg | ORAL_TABLET | Freq: Four times a day (QID) | ORAL | Status: DC
  Administered 2017-09-19 – 2017-09-21 (×8): 600 mg via ORAL
  Filled 2017-09-19 (×8): qty 1

## 2017-09-19 MED ORDER — PHENYLEPHRINE 40 MCG/ML (10ML) SYRINGE FOR IV PUSH (FOR BLOOD PRESSURE SUPPORT)
80.0000 ug | PREFILLED_SYRINGE | INTRAVENOUS | Status: DC | PRN
  Filled 2017-09-19: qty 10
  Filled 2017-09-19: qty 5

## 2017-09-19 MED ORDER — TERBUTALINE SULFATE 1 MG/ML IJ SOLN
0.2500 mg | Freq: Once | INTRAMUSCULAR | Status: DC | PRN
  Filled 2017-09-19: qty 1

## 2017-09-19 MED ORDER — DIPHENHYDRAMINE HCL 50 MG/ML IJ SOLN: 12.5000 mg | INTRAMUSCULAR | Status: DC | PRN

## 2017-09-19 MED ORDER — PHENYLEPHRINE 40 MCG/ML (10ML) SYRINGE FOR IV PUSH (FOR BLOOD PRESSURE SUPPORT)
80.0000 ug | PREFILLED_SYRINGE | INTRAVENOUS | Status: DC | PRN
  Filled 2017-09-19: qty 5

## 2017-09-19 MED ORDER — OXYTOCIN 40 UNITS IN LACTATED RINGERS INFUSION - SIMPLE MED: 2.5000 [IU]/h | INTRAVENOUS | Status: DC

## 2017-09-19 MED ORDER — OXYTOCIN BOLUS FROM INFUSION
500.0000 mL | Freq: Once | INTRAVENOUS | Status: AC
  Administered 2017-09-19: 500 mL via INTRAVENOUS

## 2017-09-19 MED ORDER — LORATADINE 10 MG PO TABS
10.0000 mg | ORAL_TABLET | Freq: Every day | ORAL | Status: DC
  Administered 2017-09-20 – 2017-09-21 (×2): 10 mg via ORAL
  Filled 2017-09-19 (×2): qty 1

## 2017-09-19 MED ORDER — TETANUS-DIPHTH-ACELL PERTUSSIS 5-2.5-18.5 LF-MCG/0.5 IM SUSP: 0.5000 mL | Freq: Once | INTRAMUSCULAR | Status: DC

## 2017-09-19 MED ORDER — OXYCODONE-ACETAMINOPHEN 5-325 MG PO TABS: 1.0000 | ORAL_TABLET | ORAL | Status: DC | PRN

## 2017-09-19 MED ORDER — FENTANYL 2.5 MCG/ML BUPIVACAINE 1/10 % EPIDURAL INFUSION (WH - ANES)
14.0000 mL/h | INTRAMUSCULAR | Status: DC | PRN
  Administered 2017-09-19: 14 mL/h via EPIDURAL
  Filled 2017-09-19: qty 100

## 2017-09-19 MED ORDER — LACTATED RINGERS IV SOLN: 500.0000 mL | Freq: Once | INTRAVENOUS | Status: DC

## 2017-09-19 MED ORDER — BENZOCAINE-MENTHOL 20-0.5 % EX AERO
1.0000 "application " | INHALATION_SPRAY | CUTANEOUS | Status: DC | PRN
  Administered 2017-09-19: 1 via TOPICAL
  Filled 2017-09-19 (×2): qty 56

## 2017-09-19 MED ORDER — ACETAMINOPHEN 325 MG PO TABS
650.0000 mg | ORAL_TABLET | ORAL | Status: DC | PRN
  Administered 2017-09-20 – 2017-09-21 (×3): 650 mg via ORAL
  Filled 2017-09-19 (×3): qty 2

## 2017-09-19 MED ORDER — FAMOTIDINE 20 MG PO TABS
20.0000 mg | ORAL_TABLET | Freq: Every day | ORAL | Status: DC
  Filled 2017-09-19: qty 1

## 2017-09-19 MED ORDER — ZOLPIDEM TARTRATE 5 MG PO TABS: 5.0000 mg | ORAL_TABLET | Freq: Every evening | ORAL | Status: DC | PRN

## 2017-09-19 MED ORDER — EPHEDRINE 5 MG/ML INJ
10.0000 mg | INTRAVENOUS | Status: DC | PRN
  Filled 2017-09-19: qty 2

## 2017-09-19 MED ORDER — OXYCODONE HCL 5 MG PO TABS: 5.0000 mg | ORAL_TABLET | ORAL | Status: DC | PRN

## 2017-09-19 MED ORDER — LACTATED RINGERS IV SOLN
500.0000 mL | INTRAVENOUS | Status: DC | PRN
  Administered 2017-09-19: 500 mL via INTRAVENOUS

## 2017-09-19 MED ORDER — OXYTOCIN 40 UNITS IN LACTATED RINGERS INFUSION - SIMPLE MED
1.0000 m[IU]/min | INTRAVENOUS | Status: DC
  Administered 2017-09-19: 2 m[IU]/min via INTRAVENOUS
  Filled 2017-09-19: qty 1000

## 2017-09-19 MED ORDER — OXYCODONE HCL 5 MG PO TABS: 10.0000 mg | ORAL_TABLET | ORAL | Status: DC | PRN

## 2017-09-19 MED ORDER — ACETAMINOPHEN 325 MG PO TABS: 650.0000 mg | ORAL_TABLET | ORAL | Status: DC | PRN

## 2017-09-19 MED ORDER — ALBUTEROL SULFATE (2.5 MG/3ML) 0.083% IN NEBU: 3.0000 mL | INHALATION_SOLUTION | Freq: Four times a day (QID) | RESPIRATORY_TRACT | Status: DC | PRN

## 2017-09-19 MED ORDER — OXYCODONE-ACETAMINOPHEN 5-325 MG PO TABS: 2.0000 | ORAL_TABLET | ORAL | Status: DC | PRN

## 2017-09-19 MED ORDER — DIBUCAINE 1 % RE OINT: 1.0000 "application " | TOPICAL_OINTMENT | RECTAL | Status: DC | PRN

## 2017-09-19 MED ORDER — ONDANSETRON HCL 4 MG PO TABS: 4.0000 mg | ORAL_TABLET | ORAL | Status: DC | PRN

## 2017-09-19 MED ORDER — SIMETHICONE 80 MG PO CHEW: 80.0000 mg | CHEWABLE_TABLET | ORAL | Status: DC | PRN

## 2017-09-19 NOTE — Anesthesia Procedure Notes (Signed)
Epidural Patient location during procedure: OB  Staffing Anesthesiologist: Lewie LoronGermeroth, Campbell Agramonte, MD Performed: anesthesiologist   Preanesthetic Checklist Completed: patient identified, pre-op evaluation, timeout performed, IV checked, risks and benefits discussed and monitors and equipment checked  Epidural Patient position: sitting Prep: site prepped and draped and DuraPrep Patient monitoring: heart rate, continuous pulse ox and blood pressure Approach: midline Location: L3-L4 Injection technique: LOR air and LOR saline  Needle:  Needle type: Tuohy  Needle gauge: 17 G Needle length: 9 cm Needle insertion depth: 8 cm Catheter type: closed end flexible Catheter size: 19 Gauge Catheter at skin depth: 13 cm Test dose: negative  Assessment Sensory level: T8 Events: blood not aspirated, injection not painful, no injection resistance, negative IV test and no paresthesia  Additional Notes Reason for block:procedure for pain

## 2017-09-19 NOTE — Anesthesia Pain Management Evaluation Note (Signed)
  CRNA Pain Management Visit Note  Patient: Laura Jenkins, 31 y.o., female  "Hello I am a [facility] of the [facility]. We have an anesthesia team available at all times to provide care throughout the hospital, including epidural management and anesthesia for C-section. I don't know your plan for the delivery whether it a natural birth, water birth, IV sedation, nitrous supplementation, doula or epidural, but we want to meet your pain goals."   1.Was your pain managed to your expectations on prior hospitalizations?   Yes   2.What is your expectation for pain management during this hospitalization?     Epidural  3.How can we help you reach that goal? Epidural when ready  Record the patient's initial score and the patient's pain goal.   Pain: 2  Pain Goal: 4 [facility] wants you to be able to say your pain was always managed very well.  Laura Jenkins 09/19/2017

## 2017-09-19 NOTE — Lactation Note (Signed)
This note was copied from a baby's chart. 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 Mom reports that she pumped and bottle-feed first child d/t lip and tongue tie and the whole experience was very stressful. Mom states that she very much wants to nurse this child. Discussed with mom that the baby's oral anatomy does not dictate function, so enc mom to focus on breastfeeding at least for the first 24 hours--then we can start to assess for any oral restriction if needed. Assisted mom to latch baby to right breast in football position, but baby sleepy and would only suck once or twice. Discussed infant behavior with parents and assisted mom to hand express and spoon-feed baby 5 ml of easily expressible colostrum--and baby tolerated well. Placed baby on mom's chest STS and enc mom to continue to offer STS and latching as baby cueing. Enc parents to continue to supplement with EBM if baby not latching and nursing well.  Mom has breast shells and is wearing them. Reviewed positioning and how to support baby's head and her breast while nursing. [facility] given [facility], aware of OP/BFSG and LC phone line assistance after D/C.   Maternal Data Has patient been taught Hand Expression?: Yes Does the patient have breastfeeding experience prior to this delivery?: Yes  Feeding Feeding Type: Breast Fed Length of feed: 0 min  LATCH Score Latch: Too sleepy or reluctant, no latch achieved, no sucking elicited.  Audible Swallowing: None  Type of Nipple: Everted at rest and after stimulation(short shaft.)  Comfort (Breast/Nipple): Soft / non-tender  Hold (Positioning): Assistance needed to correctly position infant at breast and maintain latch.  LATCH Score: 5  Interventions Interventions: Breast  feeding basics reviewed;Assisted with latch;Skin to skin;Hand express;Breast compression;Adjust position;Support pillows;Position options;Expressed milk  Lactation Tools Discussed/Used Tools: Shells   Consult Status Consult Status: Follow-up Date: 09/20/17 Follow-up type: In-patient    Sherlyn HayJennifer D Madicyn Mesina 09/19/2017, 10:05 PM

## 2017-09-19 NOTE — Progress Notes (Signed)
Patient ID: Laura Jenkins, female   DOB: 01/23/1986, 31 y.o.   MRN: [id number] Pt comfortable with epidural  afeb vss FHR overall reassuring good variability and mild variables with contractions  Cervix 90/7+/0 OP  [place] Will reassess in an hour

## 2017-09-19 NOTE — Progress Notes (Signed)
Patient ID: Laura Jenkins, female   DOB: 1986-09-07, 31 y.o.   MRN: [id number] Pt comfortable with epidural afeb BP stable FHR category 1  Cervix 60/3/-2  IUPC placed   Pitocin at 10mu, will increase until adequate

## 2017-09-19 NOTE — Anesthesia Preprocedure Evaluation (Signed)
Anesthesia Evaluation  Patient identified by MRN, date of birth, ID band Patient awake    Reviewed: Allergy & Precautions, H&P , Patient's Chart, lab work & pertinent test results  Airway Mallampati: I  TM Distance: >3 FB Neck ROM: full    Dental no notable dental hx.    Pulmonary asthma , former smoker,    Pulmonary exam normal        Cardiovascular hypertension, Normal cardiovascular exam Rhythm:regular     Neuro/Psych PSYCHIATRIC DISORDERS Anxiety Depression negative neurological ROS     GI/Hepatic negative GI ROS, Neg liver ROS,   Endo/Other  negative endocrine ROS  Renal/GU negative Renal ROS     Musculoskeletal   Abdominal (+) + obese,   Peds  Hematology  (+) anemia ,   Anesthesia Other Findings   Reproductive/Obstetrics (+) Pregnancy                             Anesthesia Physical  Anesthesia Plan  ASA: II  Anesthesia Plan: Epidural   Post-op Pain Management:    Induction:   PONV Risk Score and Plan:   Airway Management Planned:   Additional Equipment:   Intra-op Plan:   Post-operative Plan:   Informed Consent: I have reviewed the patients History and Physical, chart, labs and discussed the procedure including the risks, benefits and alternatives for the proposed anesthesia with the patient or authorized representative who has indicated his/her understanding and acceptance.     Plan Discussed with:   Anesthesia Plan Comments:         Anesthesia Quick Evaluation

## 2017-09-19 NOTE — Anesthesia Postprocedure Evaluation (Signed)
Anesthesia Post Note  Patient: Laura Jenkins  Procedure(s) Performed: AN AD HOC LABOR EPIDURAL     Patient location during evaluation: Mother Baby Anesthesia Type: Epidural Level of consciousness: awake and alert Pain management: pain level controlled Vital Signs Assessment: post-procedure vital signs reviewed and stable Respiratory status: spontaneous breathing, nonlabored ventilation and respiratory function stable Cardiovascular status: stable Postop Assessment: no headache, no backache and epidural receding Anesthetic complications: no    Last Vitals:  Vitals:   09/19/17 1745 09/19/17 1825  BP: 119/82 129/74  Pulse: 95 95  Resp: 18 20  Temp:  36.8 C  SpO2:      Last Pain:  Vitals:   09/19/17 1825  TempSrc: Oral  PainSc:    Pain Goal:                 EchoStarMERRITT,Audrionna Lampton

## 2017-09-19 NOTE — Progress Notes (Signed)
Patient ID: Laura Jenkins, female   DOB: 24-Jan-1986, 31 y.o.   MRN: [id number] Pt feeling mild pressure  afeb vss FHR with mild variables, good variabilty and +accels  C/c/+2  Will set up and begin pushing in next 30 minutes.  Trial push with moderate maternal effort, think we can improve with positioning and coaching.

## 2017-09-19 NOTE — Progress Notes (Signed)
Patient ID: Laura DaftElizabeth M Ignelzi-Alvarenga, female   DOB: 09-10-1986, 31 y.o.   MRN: [id number] Pt here and on pitocin, feeling some contractions  afeb VSS FHR category 1  Cervix 50/2/-2 AROM clear  Epidural soon per pt request

## 2017-09-20 LAB — CBC
HCT: 31.1 % — ABNORMAL LOW (ref 36.0–46.0)
Hemoglobin: 10.1 g/dL — ABNORMAL LOW (ref 12.0–15.0)
MCH: 27 pg (ref 26.0–34.0)
MCHC: 32.5 g/dL (ref 30.0–36.0)
MCV: 83.2 fL (ref 78.0–100.0)
Platelets: 328 10*3/uL (ref 150–400)
RBC: 3.74 MIL/uL — AB (ref 3.87–5.11)
RDW: 14.6 % (ref 11.5–15.5)
WBC: 12.8 10*3/uL — AB (ref 4.0–10.5)

## 2017-09-20 NOTE — Lactation Note (Signed)
This note was copied from a baby's chart. Lactation Consultation Note  Patient Name: Laura Lamont Snowballlizabeth Ignelzi-Alvarenga ZOXWR'UToday's Date: 09/20/2017 Reason for consult: Follow-up assessment Infant is 20 hours old & seen by Lactation for follow-up assessment. Infant was asleep at mom's right breast in football hold [facility]. Mom reports that baby has been very sleepy at the breast and that her nipple flattens after baby comes off the breast. Mom also reports some nipple discomfort. LC helped wake baby up and tried to latch to right breast again in football hold. Mom's right nipple shaft is short but when compressed, flattens/ inverts. Baby latched but did not suckle more than 1-2 times before coming off the nipple; baby did this a few times and mom reported discomfort so showed mom how to apply slight pressure to baby's chin to help widen the latch but baby still would not suckle. Mom asked about using a nipple shield since she had used one with her previous child. Mom reports her left nipple was even more flat than her right and that she has been wearing breast shells but that they do not help that much. Fitted mom with a size 20mm Nipple Shield; showed her how to use & clean the shield. Baby latched onto the shield and suckled. Baby was suckling mostly the shaft of the shield; showed mom again how to do a chin tug while baby latched but baby would not widen his latch. Encouraged mom to unlatch and relatch but baby fell asleep and would not relatch. Set up mom with a DEBP- showed her how to use & clean it. Encouraged mom to pump 4-6x on Initiate phase after BF with the nipple shield. Mom had a hand pump in the room and there was colostrum in the bottle. Discussed how the nipple shield is a temporary tool and for her to try without it every few days. Mom encouraged to feed baby 8-12 times/24 hours and with feeding cues.  Mom reports no questions. Encouraged mom to ask for help as needed.  Information  relayed to Lupita Leashonna, RN after consult.   Maternal Data    Feeding Feeding Type: Breast Fed Length of feed: 2 min  LATCH Score Latch: Repeated attempts needed to sustain latch, nipple held in mouth throughout feeding, stimulation needed to elicit sucking reflex.  Audible Swallowing: A few with stimulation  Type of Nipple: Flat(inverts with compression)  Comfort (Breast/Nipple): Soft / non-tender  Hold (Positioning): Assistance needed to correctly position infant at breast and maintain latch.  LATCH Score: 6  Interventions Interventions: Breast feeding basics reviewed;Assisted with latch;Adjust position;Support pillows;Expressed milk;Shells;DEBP  Lactation Tools Discussed/Used Tools: Shells;Nipple Shields Nipple shield size: 20 Shell Type: Inverted WIC Program: Yes Pump Review: Setup, frequency, and cleaning   Consult Status Consult Status: Follow-up Date: 09/21/17 Follow-up type: In-patient    Oneal GroutLaura C Moe Brier 09/20/2017, 2:17 PM

## 2017-09-20 NOTE — Progress Notes (Signed)
MOB was referred for history of depression/anxiety. * Referral screened out by Clinical Social Worker because none of the following criteria appear to apply: ~ History of anxiety/depression during this pregnancy, or of post-partum depression. ~ Diagnosis of anxiety and/or depression within last 3 years OR * MOB's symptoms currently being treated with medication and/or therapy.  MOB met with a CSW on 09/29/2015 and received PPD education.   Please contact the Clinical Social Worker if needs arise, by MOB request, or if MOB scores greater than 9/yes to question 10 on [place] Postpartum Depression Screen.  Mychal Decarlo Boyd-Gilyard, MSW, LCSW Clinical Social Work [phone]  

## 2017-09-20 NOTE — Progress Notes (Signed)
Post Partum Day 1 Subjective: up ad lib, voiding, tolerating PO, + flatus and pain controlled with ibuprofen. She reports easier delivery this time around so feeling better overall postpartum. Bonding well with baby - breastfeeding. Cramping as expected after feeds. Lochia mild  Objective: Blood pressure 124/84, pulse 82, temperature 98.3 F (36.8 C), temperature source Oral, resp. rate 18, height 5\' 4"  (1.626 m), weight 215 lb (97.5 kg), SpO2 99 %, unknown if currently breastfeeding.  Physical Exam:  General: alert, cooperative and no distress Lochia: appropriate Uterine Fundus: firm Incision: n/a DVT Evaluation: No evidence of DVT seen on physical exam. +1 edema up to calves b/l; no homans  Recent Labs    09/19/17 0752 09/20/17 0514  HGB 11.0* 10.1*  HCT 34.4* 31.1*    Assessment/Plan: Plan for discharge tomorrow, Breastfeeding and Circumcision prior to discharge  Routine pp care   LOS: 1 day   Laura Jenkins 09/20/2017, 10:30 AM

## 2017-09-21 MED ORDER — BUSPIRONE HCL 15 MG PO TABS: 15.0000 mg | ORAL_TABLET | Freq: Two times a day (BID) | ORAL | 2 refills | Status: AC

## 2017-09-21 MED ORDER — IBUPROFEN 600 MG PO TABS: 600.0000 mg | ORAL_TABLET | Freq: Four times a day (QID) | ORAL | 1 refills | Status: DC | PRN

## 2017-09-21 NOTE — Discharge Instructions (Signed)
Nothing in vagina for 6 weeks.  No sex, tampons, and douching.  Other instructions as in [facility] Discharge Booklet. °

## 2017-09-21 NOTE — Progress Notes (Addendum)
Patient ID: Laura Jenkins, female   DOB: 03/04/1986, 31 y.o.   MRN: [id number] Pt doing well with no complaints. Still happy she is recovering better this time compared to last. Pain well controlled, ambulating and voiding with no issues. Lochia mild. Bonding well with baby.Having issues with breastfeeding due to baby being tongue tied VSS ABD - FF EXT - no homans  A/P: PPD#2 s/p svd - stable         Resume buspar 15mg  po bid         Discharge instructions reviewed         Pt to nest due to baby needing bili lights - likely to be discharged home tomorrow - Baby for procedure today with peds (tongue tied) hence per peds circ to be postponed till tomorrow.

## 2017-09-21 NOTE — Lactation Note (Signed)
This note was copied from a baby's chart. Lactation Consultation Note; Infant to be a baby patient. MD in with Mother . Ad[facility]. Mother has been using a #20 nipple shield due to mothers flat nipples and  tongue restriction. Mother reports that [facility]. Mother informed that I would [facility].  Mother has been using DEBP and post pumping. Mother also has a hand pump at the bedside.   Patient Name: Laura Jenkins WUJWJ'XToday's Date: 09/21/2017     Maternal Data    Feeding Length of feed: 15 min  LATCH Score Latch: Grasps breast easily, tongue down, lips flanged, rhythmical sucking.  Audible Swallowing: Spontaneous and intermittent  Type of Nipple: Everted at rest and after stimulation  Comfort (Breast/Nipple): Filling, red/small blisters or bruises, mild/mod discomfort  Hold (Positioning): Assistance needed to correctly position infant at breast and maintain latch.  LATCH Score: 8  Interventions    Lactation Tools Discussed/Used     Consult Status      [name], [name] 09/21/2017, 1:56 PM

## 2017-09-21 NOTE — Discharge Summary (Signed)
OB Discharge Summary     Patient Name: Laura Jenkins DOB: 01-01-1986 MRN: [id number]  Date of admission: 09/19/2017 Delivering MD: Huel CoteICHARDSON, KATHY   Date of discharge: 09/21/2017  Admitting diagnosis: INDUCTION Intrauterine pregnancy: 58100w2d     Secondary diagnosis:  Active Problems:   Indication for care in labor and delivery, antepartum   NSVD (normal spontaneous vaginal delivery)  Additional problems: none     Discharge diagnosis: Term Pregnancy Delivered                                                                                                Post partum procedures:none  Augmentation: AROM and Pitocin  Complications: None  Hospital course:  Induction of Labor With Vaginal Delivery   31 y.o. yo [id number] at 7100w2d was admitted to the hospital 09/19/2017 for induction of labor.  Indication for induction: Favorable cervix at term.  Patient had an uncomplicated labor course as follows: Membrane Rupture Time/Date: 9:30 AM ,09/19/2017   Intrapartum Procedures: Episiotomy: None [1]                                         Lacerations:  1st degree [2]  Patient had delivery of a Viable infant.  Information for the patient's newborn:  Drenda Freezegnelzi-Alvarenga, Boy Benetta [[id number]][[id number]]  Delivery Method: Vaginal, Spontaneous(Filed from Delivery Summary)   09/19/2017  Details of delivery can be found in separate delivery note.  Patient had a routine postpartum course. Patient is discharged home 09/21/17.  Physical exam  Vitals:   09/19/17 1925 09/19/17 2340 09/20/17 0953 09/21/17 0545  BP: 132/83 106/68 124/84 (!) 118/51  Pulse: 100 97 82 80  Resp: 18 18 18 16   Temp: 98 F (36.7 C) 97.9 F (36.6 C) 98.3 F (36.8 C) 97.7 F (36.5 C)  TempSrc: Oral Oral Oral Oral  SpO2: 98% 100% 99%   Weight:      Height:       General: alert, cooperative and no distress Lochia: appropriate Uterine Fundus: firm Incision: N/A DVT Evaluation: No evidence of DVT seen on  physical exam. No significant calf/ankle edema. Labs: Lab Results  Component Value Date   WBC 12.8 (H) 09/20/2017   HGB 10.1 (L) 09/20/2017   HCT 31.1 (L) 09/20/2017   MCV 83.2 09/20/2017   PLT 328 09/20/2017   CMP Latest Ref Rng & Units 09/19/2017  Glucose 65 - 99 mg/dL 034(V105(H)  BUN 6 - 20 mg/dL <4(Q<5(L)  Creatinine 5.950.44 - 1.00 mg/dL 6.380.61  Sodium 756135 - 433145 mmol/L 135  Potassium 3.5 - 5.1 mmol/L 4.0  Chloride 101 - 111 mmol/L 106  CO2 22 - 32 mmol/L 20(L)  Calcium 8.9 - 10.3 mg/dL 2.9(J8.8(L)  Total Protein 6.5 - 8.1 g/dL 6.6  Total Bilirubin 0.3 - 1.2 mg/dL 1.8(A0.2(L)  Alkaline Phos 38 - 126 U/L 173(H)  AST 15 - 41 U/L 23  ALT 14 - 54 U/L 17    Discharge instruction: per After Visit Summary and "Baby and Me Booklet".  After visit meds:  Allergies as of 09/21/2017      Reactions   Phentermine    hypertension      Medication List    TAKE these medications   albuterol 108 (90 Base) MCG/ACT inhaler Commonly known as:  PROVENTIL HFA;VENTOLIN HFA Inhale 1-2 puffs into the lungs every 6 (six) hours as needed for wheezing or shortness of breath.   cetirizine 10 MG tablet Commonly known as:  ZYRTEC Take 10 mg by mouth daily.   cimetidine 200 MG tablet Commonly known as:  TAGAMET Take 200 mg by mouth at bedtime.   [employer] PRENATAL GUMMY PO Take 2 each by mouth daily.   docusate sodium 100 MG capsule Commonly known as:  COLACE Take 100 mg by mouth daily as needed for mild constipation.   FLOVENT HFA 44 MCG/ACT inhaler Generic drug:  fluticasone Flovent HFA 44 mcg/actuation aerosol inhaler  TAKE 2 PUFFS BY MOUTH TWICE A DAY   ibuprofen 600 MG tablet Commonly known as:  ADVIL,MOTRIN Take 1 tablet (600 mg total) by mouth every 6 (six) hours as needed for moderate pain or cramping.   valACYclovir 1000 MG tablet Commonly known as:  VALTREX Take 1,000 mg by mouth daily as needed.       Diet: routine diet  Activity: Advance as tolerated. Pelvic rest for 6 weeks.    Outpatient follow ZO:XWRUEup:Phone call in three weeks and postpartum visit in 6 weeks Follow up Appt:No future appointments. Follow up Visit:No Follow-up on file.  Postpartum contraception: Not Discussed  Newborn Data: Live born female  Birth Weight: 7 lb 11.1 oz (3490 g) APGAR: 8, 9  Newborn Delivery   Birth date/time:  09/19/2017 16:27:00 Delivery type:  Vaginal, Spontaneous     Baby Feeding: Breast Disposition:rooming in   09/21/2017 Cathrine Musterecilia W Kinsley Nicklaus, DO

## 2017-09-22 ENCOUNTER — Ambulatory Visit: Payer: Self-pay

## 2017-09-22 NOTE — Lactation Note (Signed)
This note was copied from a baby's chart. Lactation Consultation Note  Patient Name: Laura Lamont Snowballlizabeth Ignelzi-Alvarenga ZOXWR'UToday's Date: 09/22/2017 Reason for consult: Follow-up assessment Milk is in.  Mom just pumped 150 mls.  Breasts are full but not engorged.  Baby is sleepy from circumcision and phototherapy.  Assisted with positioning baby in football hold on right side.  Mom hand expressed a few drops of milk.  Baby not showing any interest in feeding.  LC phone number left to call for assist later today.  Maternal Data    Feeding Feeding Type: Breast Fed Length of feed: 0 min(too sleepy)  LATCH Score Latch: Too sleepy or reluctant, no latch achieved, no sucking elicited.  Audible Swallowing: None  Type of Nipple: Everted at rest and after stimulation  Comfort (Breast/Nipple): Soft / non-tender  Hold (Positioning): Assistance needed to correctly position infant at breast and maintain latch.  LATCH Score: 5  Interventions    Lactation Tools Discussed/Used     Consult Status Consult Status: Follow-up Date: 09/23/17 Follow-up type: In-patient    Huston FoleyMOULDEN, Niyana Chesbro S 09/22/2017, 12:55 PM

## 2017-09-23 ENCOUNTER — Ambulatory Visit: Payer: Self-pay

## 2017-09-23 NOTE — Lactation Note (Addendum)
This note was copied from a baby's chart. Lactation Consultation Note  Patient Name: Boy Lamont Snowballlizabeth Ignelzi-Alvarenga ZOXWR'UToday's Date: 09/23/2017 Reason for consult: Follow-up assessment;Other (Comment)(Lingual frenotomy. )  Baby 91 hours old. Mom reports that yesterday was difficult putting baby to breast after the circumcision because baby was very fussy and pushing away from breast. Patient latching baby to right breast with #20 NS when this LC entered the room. Refitted mom with #24 NS and prefilled the shield with EBM using curve-tipped syringe. Baby actively latched deeply to #24 and suckled rhythmically with intermittent swallows noted. Baby continued to suckle rhythmically with intermittent swallows and there was good transfer of breast milk for 30 minutes of active nursing. Demonstrated to mom that baby actively transferring milk. Baby's lower lip well-flanged while nursing, and while upper lip not flanged outward very far, it was also not tucked inward at all.   Mom is pumping several ounces of breast milk from both breasts, so discussed tapering the pumping at this point as long as baby transferring well--baby had breast milk dripping from the side of his mouth at the end of this feed. Enc mom to continue enjoying having baby at breast using NS for today, and then discussed how to attempt to have baby nurse directly at the breast without the shield tomorrow--enc beginning the feed with shield to allow nipple to evert and milk to flow, then attempt to re-latch without shield. Enc mom to transition slowing so as not to frustrate baby or herself. Enc parents to offer supplement after BF as needed to make sure baby getting enough, and to do some post-pumping for comfort to prevent engorgement and protect supply while using NS. Mom reports that she has a PIS and a Spectra at home.   Discussed how to know if baby continuing to have trouble latching to breast, and answered questions about possible  follow-up with oral specialist and/or LC. Parents aware of OP/BFSG and LC phone line assistance after D/C. Enc mom to call for assistance with latching baby to left breast as needed. Mom reports she feels much better about her BF experience with this baby than the first.    Maternal Data    Feeding Feeding Type: Breast Fed Length of feed: 30 min  LATCH Score Latch: Grasps breast easily, tongue down, lips flanged, rhythmical sucking.  Audible Swallowing: Spontaneous and intermittent  Type of Nipple: Everted at rest and after stimulation  Comfort (Breast/Nipple): Soft / non-tender  Hold (Positioning): Assistance needed to correctly position infant at breast and maintain latch.  LATCH Score: 9  Interventions Interventions: Assisted with latch;Skin to skin;Adjust position;Support pillows;Position options;Expressed milk  Lactation Tools Discussed/Used Tools: Nipple Shields Nipple shield size: 24 Shell Type: Inverted Breast pump type: Double-Electric Breast Pump   Consult Status Consult Status: PRN    Sherlyn HayJennifer D Rett Stehlik 09/23/2017, 11:40 AM

## 2020-10-26 LAB — OB RESULTS CONSOLE GBS: GBS: NEGATIVE

## 2020-10-28 NOTE — L&D Delivery Note (Signed)
DELIVERY NOTE  Pt complete and at +2 station with urge to push. Epidural controlling pain. Pt pushed and delivered a viable female infant in ROA position. Anterior and posterior shoulders spontaneously delivered with next two pushes; body easily followed next. Infant placed on mothers abdomen and bulb suction of mouth and nose performed. Cord was then clamped and cut by FOB. Cord blood obtained, 3VC. Baby had a vigorous spontaneous cry noted. Placenta then delivered at 1119 intact. Fundal massage performed and pitocin per protocol. Fundus firm. The following lacerations were noted: right labial. Repaired in routine fashion with 3-0 monocryl. Mother and baby stable. Counts correct. EBL 200cc  Infant time: 1116 Gender: female, desires circ Placenta time: 1119 Apgars: 9/9 Weight: pending skin-to-skin

## 2020-11-08 ENCOUNTER — Telehealth (HOSPITAL_COMMUNITY): Payer: Self-pay | Admitting: *Deleted

## 2020-11-08 NOTE — Telephone Encounter (Signed)
Preadmission screen  

## 2020-11-10 ENCOUNTER — Encounter (HOSPITAL_COMMUNITY): Payer: Self-pay | Admitting: Obstetrics and Gynecology

## 2020-11-10 ENCOUNTER — Telehealth (HOSPITAL_COMMUNITY): Payer: Self-pay | Admitting: *Deleted

## 2020-11-10 ENCOUNTER — Other Ambulatory Visit: Payer: Self-pay

## 2020-11-10 ENCOUNTER — Encounter (HOSPITAL_COMMUNITY): Payer: Self-pay | Admitting: *Deleted

## 2020-11-10 ENCOUNTER — Inpatient Hospital Stay (HOSPITAL_COMMUNITY)
Admission: AD | Admit: 2020-11-10 | Discharge: 2020-11-12 | DRG: 806 | Disposition: A | Discharge status: Home / Self Care | Payer: Medicaid Other | Attending: Obstetrics and Gynecology | Admitting: Obstetrics and Gynecology

## 2020-11-10 DIAGNOSIS — A6000 Herpesviral infection of urogenital system, unspecified: Secondary | ICD-10-CM | POA: Diagnosis present

## 2020-11-10 DIAGNOSIS — Z3A38 38 weeks gestation of pregnancy: Secondary | ICD-10-CM

## 2020-11-10 DIAGNOSIS — Z141 Cystic fibrosis carrier: Secondary | ICD-10-CM | POA: Diagnosis not present

## 2020-11-10 DIAGNOSIS — Z87891 Personal history of nicotine dependence: Secondary | ICD-10-CM | POA: Diagnosis not present

## 2020-11-10 DIAGNOSIS — O99344 Other mental disorders complicating childbirth: Secondary | ICD-10-CM | POA: Diagnosis present

## 2020-11-10 DIAGNOSIS — O9832 Other infections with a predominantly sexual mode of transmission complicating childbirth: Secondary | ICD-10-CM | POA: Diagnosis present

## 2020-11-10 DIAGNOSIS — F419 Anxiety disorder, unspecified: Secondary | ICD-10-CM | POA: Diagnosis present

## 2020-11-10 DIAGNOSIS — J45909 Unspecified asthma, uncomplicated: Secondary | ICD-10-CM | POA: Diagnosis present

## 2020-11-10 DIAGNOSIS — O134 Gestational [pregnancy-induced] hypertension without significant proteinuria, complicating childbirth: Principal | ICD-10-CM | POA: Diagnosis present

## 2020-11-10 DIAGNOSIS — O99214 Obesity complicating childbirth: Secondary | ICD-10-CM | POA: Diagnosis present

## 2020-11-10 DIAGNOSIS — Z20822 Contact with and (suspected) exposure to covid-19: Secondary | ICD-10-CM | POA: Diagnosis present

## 2020-11-10 DIAGNOSIS — O9952 Diseases of the respiratory system complicating childbirth: Secondary | ICD-10-CM | POA: Diagnosis present

## 2020-11-10 DIAGNOSIS — R030 Elevated blood-pressure reading, without diagnosis of hypertension: Secondary | ICD-10-CM | POA: Diagnosis present

## 2020-11-10 LAB — CBC
HCT: 35.4 % — ABNORMAL LOW (ref 36.0–46.0)
Hemoglobin: 12 g/dL (ref 12.0–15.0)
MCH: 28.6 pg (ref 26.0–34.0)
MCHC: 33.9 g/dL (ref 30.0–36.0)
MCV: 84.5 fL (ref 80.0–100.0)
Platelets: 327 10*3/uL (ref 150–400)
RBC: 4.19 MIL/uL (ref 3.87–5.11)
RDW: 16 % — ABNORMAL HIGH (ref 11.5–15.5)
WBC: 11.9 10*3/uL — ABNORMAL HIGH (ref 4.0–10.5)
nRBC: 0 % (ref 0.0–0.2)

## 2020-11-10 LAB — PROTEIN / CREATININE RATIO, URINE
Creatinine, Urine: 177.12 mg/dL
Protein Creatinine Ratio: 0.2 mg/mg{Cre} — ABNORMAL HIGH (ref 0.00–0.15)
Total Protein, Urine: 36 mg/dL

## 2020-11-10 LAB — TYPE AND SCREEN
ABO/RH(D): O POS
Antibody Screen: NEGATIVE

## 2020-11-10 LAB — LACTATE DEHYDROGENASE: LDH: 155 U/L (ref 98–192)

## 2020-11-10 LAB — RESP PANEL BY RT-PCR (FLU A&B, COVID) ARPGX2
Influenza A by PCR: NEGATIVE
Influenza B by PCR: NEGATIVE
SARS Coronavirus 2 by RT PCR: NEGATIVE

## 2020-11-10 LAB — URIC ACID: Uric Acid, Serum: 6.5 mg/dL (ref 2.5–7.1)

## 2020-11-10 MED ORDER — EPHEDRINE 5 MG/ML INJ: 10.0000 mg | INTRAVENOUS | Status: DC | PRN

## 2020-11-10 MED ORDER — SOD CITRATE-CITRIC ACID 500-334 MG/5ML PO SOLN: 30.0000 mL | ORAL | Status: DC | PRN

## 2020-11-10 MED ORDER — ACETAMINOPHEN 325 MG PO TABS: 650.0000 mg | ORAL_TABLET | ORAL | Status: DC | PRN

## 2020-11-10 MED ORDER — LIDOCAINE HCL (PF) 1 % IJ SOLN: 30.0000 mL | INTRAMUSCULAR | Status: DC | PRN

## 2020-11-10 MED ORDER — FLEET ENEMA 7-19 GM/118ML RE ENEM: 1.0000 | ENEMA | RECTAL | Status: DC | PRN

## 2020-11-10 MED ORDER — BUTORPHANOL TARTRATE 1 MG/ML IJ SOLN: 1.0000 mg | INTRAMUSCULAR | Status: DC | PRN

## 2020-11-10 MED ORDER — TERBUTALINE SULFATE 1 MG/ML IJ SOLN: 0.2500 mg | Freq: Once | INTRAMUSCULAR | Status: DC | PRN

## 2020-11-10 MED ORDER — LACTATED RINGERS IV SOLN: 500.0000 mL | Freq: Once | INTRAVENOUS | Status: DC

## 2020-11-10 MED ORDER — OXYCODONE-ACETAMINOPHEN 5-325 MG PO TABS: 1.0000 | ORAL_TABLET | ORAL | Status: DC | PRN

## 2020-11-10 MED ORDER — OXYCODONE-ACETAMINOPHEN 5-325 MG PO TABS: 2.0000 | ORAL_TABLET | ORAL | Status: DC | PRN

## 2020-11-10 MED ORDER — LACTATED RINGERS IV SOLN: 500.0000 mL | INTRAVENOUS | Status: DC | PRN

## 2020-11-10 MED ORDER — PHENYLEPHRINE 40 MCG/ML (10ML) SYRINGE FOR IV PUSH (FOR BLOOD PRESSURE SUPPORT)
80.0000 ug | PREFILLED_SYRINGE | INTRAVENOUS | Status: DC | PRN
  Administered 2020-11-11 (×2): 80 ug via INTRAVENOUS

## 2020-11-10 MED ORDER — DIPHENHYDRAMINE HCL 50 MG/ML IJ SOLN: 12.5000 mg | INTRAMUSCULAR | Status: DC | PRN

## 2020-11-10 MED ORDER — OXYTOCIN BOLUS FROM INFUSION
333.0000 mL | Freq: Once | INTRAVENOUS | Status: AC
  Administered 2020-11-11: 333 mL via INTRAVENOUS

## 2020-11-10 MED ORDER — FENTANYL-BUPIVACAINE-NACL 0.5-0.125-0.9 MG/250ML-% EP SOLN
12.0000 mL/h | EPIDURAL | Status: DC | PRN
  Filled 2020-11-10: qty 250

## 2020-11-10 MED ORDER — OXYTOCIN-SODIUM CHLORIDE 30-0.9 UT/500ML-% IV SOLN: 2.5000 [IU]/h | INTRAVENOUS | Status: DC

## 2020-11-10 MED ORDER — ONDANSETRON HCL 4 MG/2ML IJ SOLN
4.0000 mg | Freq: Four times a day (QID) | INTRAMUSCULAR | Status: DC | PRN
  Administered 2020-11-11: 4 mg via INTRAVENOUS
  Filled 2020-11-10: qty 2

## 2020-11-10 MED ORDER — OXYTOCIN-SODIUM CHLORIDE 30-0.9 UT/500ML-% IV SOLN
1.0000 m[IU]/min | INTRAVENOUS | Status: DC
  Administered 2020-11-10: 2 m[IU]/min via INTRAVENOUS
  Filled 2020-11-10: qty 500

## 2020-11-10 MED ORDER — PHENYLEPHRINE 40 MCG/ML (10ML) SYRINGE FOR IV PUSH (FOR BLOOD PRESSURE SUPPORT)
80.0000 ug | PREFILLED_SYRINGE | INTRAVENOUS | Status: DC | PRN
  Filled 2020-11-10: qty 10

## 2020-11-10 MED ORDER — LACTATED RINGERS IV SOLN: INTRAVENOUS | Status: DC

## 2020-11-10 NOTE — MAU Note (Signed)
.   Laura Jenkins is a 35 y.o. at [redacted]w[redacted]d here in MAU reporting: Elevated BP over the past week. The office told her to come in to be seen. No HA or blurry vision. Also reports decreased fetal movement. No VB or LOF.   Pain score: 0 Vitals:   11/10/20 2115  BP: (!) 129/93  Pulse: (!) 122  Resp: 20  Temp: 97.6 F (36.4 C)     FHT:142 Lab orders placed from triage: UA

## 2020-11-10 NOTE — H&P (Signed)
Laura Jenkins is a 35 y.o. female presenting for elevated BP at home with documented elevated BP within past week. Had persistent  140s/90s at home this evening, diastolic Bps in 90s in triage today. +FM, denies VB< LOF, CTX. Denies HA, RUQ pain, LE edema, visual changes, N/V. GBS neg. 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 Anxiety   . Asthma   . Depression   . Indication for care in labor or delivery 09/26/2015  . Medical history non-contributory   . Pregnancy induced hypertension   . Seasonal allergies    Past Surgical History:  Procedure Laterality Date  . EYE SURGERY    . STRABISMUS SURGERY  1989  . toe surgery     Family History: family history includes Asthma in her mother, sister, and sister; COPD in her maternal grandmother and paternal grandmother; Cancer in her maternal grandmother and paternal grandmother; Gout in her father; Miscarriages / [place] in her sister. Social History:  reports that she quit smoking about 11 years ago. She has never used smokeless tobacco. She reports that she does not drink alcohol and does not use drugs.     Maternal Diabetes: No 1hr 123 Genetic Screening: Normal Maternal Ultrasounds/Referrals: Normal Fetal Ultrasounds or other Referrals:  None Maternal Substance Abuse:  No Significant Maternal Medications:  None Significant Maternal Lab Results:  Group B Strep negative Other Comments:  None  Review of Systems  Constitutional: Negative for chills and fever.  Respiratory: Negative for shortness of breath.   Cardiovascular: Negative for chest pain, palpitations and leg swelling.  Gastrointestinal: Negative for abdominal  pain, nausea and vomiting.  Neurological: Negative for dizziness, weakness and headaches.  Psychiatric/Behavioral: Negative for suicidal ideas.   Maternal Medical History:  Reason for admission: Nausea.    Dilation: 2 Effacement (%): 50 Station: -3 Exam by:: Dr. Reina Fuse Blood pressure (!) 129/93, pulse (!) 122, temperature 97.6 F (36.4 C), temperature source Oral, resp. rate 20, height 5\' 4"  (1.626 m), weight 104 kg, unknown if currently breastfeeding. Exam Physical Exam Constitutional:      General: She is not in acute distress.    Appearance: She is well-developed and well-nourished.  HENT:     Head: Normocephalic and atraumatic.  Eyes:     Pupils: Pupils are equal, round, and reactive to light.  Cardiovascular:     Rate and Rhythm: Normal rate and regular rhythm.     Heart sounds: No murmur heard. No gallop.   Abdominal:     Tenderness: There is no abdominal tenderness. There is no guarding or rebound.     Comments: gravid  Genitourinary:    Vagina: Normal.     Uterus: Normal.      Comments: Neg for HSV lesions Musculoskeletal:        General: Normal range of motion.     Cervical back: Normal range of motion and neck supple.  Skin:    General:  Skin is warm and dry.  Neurological:     Mental Status: She is alert and oriented to person, place, and time.     Prenatal labs: ABO, Rh: --/--/PENDING (01/14 2122) Antibody: PENDING (01/14 2122) Rubella:  imm RPR:   nr HBsAg:   neg HIV:   nr GBS: Negative/-- (12/30 0000)   Assessment/Plan: This is a 34yo  G3P2002 @ 38 2/7 by LMP c/w 10wk scan admitted for IOL for GHTN. Cat 1 tracing, neg TOCO, PreE labs, COVID pending. Initiate pitocin per protocol and titrate. AROM after epidural  Laura Jenkins 11/10/2020, 10:20 PM

## 2020-11-10 NOTE — Telephone Encounter (Signed)
Preadmission screen  

## 2020-11-11 ENCOUNTER — Encounter (HOSPITAL_COMMUNITY): Payer: Self-pay | Admitting: Obstetrics and Gynecology

## 2020-11-11 ENCOUNTER — Inpatient Hospital Stay (HOSPITAL_COMMUNITY): Payer: Medicaid Other | Admitting: Anesthesiology

## 2020-11-11 LAB — COMPREHENSIVE METABOLIC PANEL
ALT: 16 U/L (ref 0–44)
AST: 25 U/L (ref 15–41)
Albumin: 2.3 g/dL — ABNORMAL LOW (ref 3.5–5.0)
Alkaline Phosphatase: 125 U/L (ref 38–126)
Anion gap: 12 (ref 5–15)
BUN: <5 mg/dL — ABNORMAL LOW (ref 6–20)
CO2: 17 mmol/L — ABNORMAL LOW (ref 22–32)
Calcium: 9.1 mg/dL (ref 8.9–10.3)
Chloride: 106 mmol/L (ref 98–111)
Creatinine, Ser: 0.68 mg/dL (ref 0.44–1.00)
GFR, Estimated: >60 mL/min (ref >60)
Glucose, Bld: 106 mg/dL — ABNORMAL HIGH (ref 70–99)
Potassium: 3.7 mmol/L (ref 3.5–5.1)
Sodium: 135 mmol/L (ref 135–145)
Total Bilirubin: 0.4 mg/dL (ref 0.3–1.2)
Total Protein: 5.8 g/dL — ABNORMAL LOW (ref 6.5–8.1)

## 2020-11-11 LAB — CBC
HCT: 36.9 % (ref 36.0–46.0)
Hemoglobin: 11.7 g/dL — ABNORMAL LOW (ref 12.0–15.0)
MCH: 27.4 pg (ref 26.0–34.0)
MCHC: 31.7 g/dL (ref 30.0–36.0)
MCV: 86.4 fL (ref 80.0–100.0)
Platelets: 288 10*3/uL (ref 150–400)
RBC: 4.27 MIL/uL (ref 3.87–5.11)
RDW: 16 % — ABNORMAL HIGH (ref 11.5–15.5)
WBC: 22.1 10*3/uL — ABNORMAL HIGH (ref 4.0–10.5)
nRBC: 0 % (ref 0.0–0.2)

## 2020-11-11 LAB — RPR: RPR Ser Ql: NONREACTIVE

## 2020-11-11 MED ORDER — DIBUCAINE (PERIANAL) 1 % EX OINT: 1.0000 "application " | TOPICAL_OINTMENT | CUTANEOUS | Status: DC | PRN

## 2020-11-11 MED ORDER — ALBUTEROL SULFATE (2.5 MG/3ML) 0.083% IN NEBU: 3.0000 mL | INHALATION_SOLUTION | Freq: Four times a day (QID) | RESPIRATORY_TRACT | Status: DC | PRN

## 2020-11-11 MED ORDER — ONDANSETRON HCL 4 MG PO TABS: 4.0000 mg | ORAL_TABLET | ORAL | Status: DC | PRN

## 2020-11-11 MED ORDER — ACETAMINOPHEN 325 MG PO TABS
650.0000 mg | ORAL_TABLET | ORAL | Status: DC | PRN
  Administered 2020-11-12: 650 mg via ORAL
  Filled 2020-11-11: qty 2

## 2020-11-11 MED ORDER — DIPHENHYDRAMINE HCL 50 MG/ML IJ SOLN: 12.5000 mg | INTRAMUSCULAR | Status: DC | PRN

## 2020-11-11 MED ORDER — DIPHENHYDRAMINE HCL 25 MG PO CAPS
25.0000 mg | ORAL_CAPSULE | Freq: Four times a day (QID) | ORAL | Status: DC | PRN
Start: 2020-11-11 — End: 2020-11-12
  Administered 2020-11-11: 25 mg via ORAL
  Filled 2020-11-11: qty 1

## 2020-11-11 MED ORDER — IBUPROFEN 600 MG PO TABS
600.0000 mg | ORAL_TABLET | Freq: Four times a day (QID) | ORAL | Status: DC
  Administered 2020-11-11 – 2020-11-12 (×5): 600 mg via ORAL
  Filled 2020-11-11 (×5): qty 1

## 2020-11-11 MED ORDER — SENNOSIDES-DOCUSATE SODIUM 8.6-50 MG PO TABS
2.0000 | ORAL_TABLET | Freq: Every day | ORAL | Status: DC
  Administered 2020-11-12: 2 via ORAL
  Filled 2020-11-11: qty 2

## 2020-11-11 MED ORDER — TETANUS-DIPHTH-ACELL PERTUSSIS 5-2.5-18.5 LF-MCG/0.5 IM SUSY: 0.5000 mL | PREFILLED_SYRINGE | Freq: Once | INTRAMUSCULAR | Status: DC

## 2020-11-11 MED ORDER — WITCH HAZEL-GLYCERIN EX PADS
1.0000 "application " | MEDICATED_PAD | CUTANEOUS | Status: DC | PRN
  Administered 2020-11-11: 1 via TOPICAL

## 2020-11-11 MED ORDER — SIMETHICONE 80 MG PO CHEW: 80.0000 mg | CHEWABLE_TABLET | ORAL | Status: DC | PRN

## 2020-11-11 MED ORDER — ONDANSETRON HCL 4 MG/2ML IJ SOLN: 4.0000 mg | INTRAMUSCULAR | Status: DC | PRN

## 2020-11-11 MED ORDER — LIDOCAINE HCL (PF) 1 % IJ SOLN
INTRAMUSCULAR | Status: DC | PRN
  Administered 2020-11-11 (×2): 8 mL via EPIDURAL

## 2020-11-11 MED ORDER — FENTANYL-BUPIVACAINE-NACL 0.5-0.125-0.9 MG/250ML-% EP SOLN: 12.0000 mL/h | EPIDURAL | Status: DC | PRN

## 2020-11-11 MED ORDER — BENZOCAINE-MENTHOL 20-0.5 % EX AERO
1.0000 "application " | INHALATION_SPRAY | CUTANEOUS | Status: DC | PRN
  Filled 2020-11-11: qty 56

## 2020-11-11 MED ORDER — ZOLPIDEM TARTRATE 5 MG PO TABS: 5.0000 mg | ORAL_TABLET | Freq: Every evening | ORAL | Status: DC | PRN

## 2020-11-11 MED ORDER — BUSPIRONE HCL 5 MG PO TABS
15.0000 mg | ORAL_TABLET | Freq: Two times a day (BID) | ORAL | Status: DC
  Administered 2020-11-12: 15 mg via ORAL
  Filled 2020-11-11 (×2): qty 3

## 2020-11-11 MED ORDER — SODIUM CHLORIDE (PF) 0.9 % IJ SOLN
INTRAMUSCULAR | Status: DC | PRN
  Administered 2020-11-11: 12 mL/h via EPIDURAL

## 2020-11-11 MED ORDER — PRENATAL MULTIVITAMIN CH
1.0000 | ORAL_TABLET | Freq: Every day | ORAL | Status: DC
  Administered 2020-11-12: 1 via ORAL
  Filled 2020-11-11: qty 1

## 2020-11-11 MED ORDER — COCONUT OIL OIL: 1.0000 "application " | TOPICAL_OIL | Status: DC | PRN

## 2020-11-11 NOTE — Anesthesia Preprocedure Evaluation (Signed)
Anesthesia Evaluation  Patient identified by MRN, date of birth, ID band Patient awake    Reviewed: Allergy & Precautions, H&P , NPO status , Patient's Chart, lab work & pertinent test results, reviewed documented beta blocker date and time   Airway Mallampati: III  TM Distance: >3 FB Neck ROM: full    Dental no notable dental hx. (+) Teeth Intact, Dental Advisory Given   Pulmonary neg pulmonary ROS, former smoker,    Pulmonary exam normal breath sounds clear to auscultation       Cardiovascular hypertension, negative cardio ROS Normal cardiovascular exam Rhythm:regular Rate:Normal     Neuro/Psych negative neurological ROS  negative psych ROS   GI/Hepatic negative GI ROS, Neg liver ROS,   Endo/Other  Morbid obesity  Renal/GU negative Renal ROS  negative genitourinary   Musculoskeletal   Abdominal (+) + obese,   Peds  Hematology  (+) Blood dyscrasia, anemia ,   Anesthesia Other Findings   Reproductive/Obstetrics (+) Pregnancy                             Anesthesia Physical Anesthesia Plan  ASA: III  Anesthesia Plan: Epidural   Post-op Pain Management:    Induction:   PONV Risk Score and Plan: 2  Airway Management Planned: Natural Airway  Additional Equipment: None  Intra-op Plan:   Post-operative Plan:   Informed Consent: I have reviewed the patients History and Physical, chart, labs and discussed the procedure including the risks, benefits and alternatives for the proposed anesthesia with the patient or authorized representative who has indicated his/her understanding and acceptance.     Dental Advisory Given  Plan Discussed with: Anesthesiologist  Anesthesia Plan Comments: (Labs checked- platelets confirmed with RN in room. Fetal heart tracing, per RN, reported to be stable enough for sitting procedure. Discussed epidural, and patient consents to the procedure:   included risk of possible headache,backache, failed block, allergic reaction, and nerve injury. This patient was asked if she had any questions or concerns before the procedure started.)        Anesthesia Quick Evaluation

## 2020-11-11 NOTE — Progress Notes (Signed)
Labor Note  S: More comfortable after epidural, had to get ephedrine x2 for Bps.   O: BP 104/70   Pulse 97   Temp 98.2 F (36.8 C) (Oral)   Resp 18   Ht 5\' 4"  (1.626 m)   Wt 104 kg   SpO2 99%   BMI 39.34 kg/m  CE: 2/50/-2, cervix more anterior. Clear AROM at 0140 with copious clear fluid return FHR: Baseline 140, +accels, -decels, modvariability TOCO q5-55m, pitocin at 64mU/min  uPC 0.2, Cr 0.68, 25/13, 12/35.4/327 BP WNL  A/P: This is a 35 y.o. 20 at [redacted]w[redacted]d  admitted for IOL for GHTN FWB: Cat 1 MWB: s/p epidural, asx from HTN Labor course: s/p AROM at 0140, continue to titrate pitocin  Anticipate SVD

## 2020-11-11 NOTE — Anesthesia Postprocedure Evaluation (Signed)
Anesthesia Post Note  Patient: Laura Jenkins  Procedure(s) Performed: AN AD HOC LABOR EPIDURAL     Patient location during evaluation: Mother Baby Anesthesia Type: Epidural Level of consciousness: awake and alert Pain management: pain level controlled Vital Signs Assessment: post-procedure vital signs reviewed and stable Respiratory status: spontaneous breathing, nonlabored ventilation and respiratory function stable Cardiovascular status: stable Postop Assessment: no headache, no backache and epidural receding Anesthetic complications: no   No complications documented.  Last Vitals:  Vitals:   11/11/20 1317 11/11/20 1430  BP: 120/85 122/84  Pulse: (!) 120 (!) 102  Resp: 20 17  Temp: 36.7 C 36.7 C  SpO2: 99% 98%    Last Pain:  Vitals:   11/11/20 1430  TempSrc:   PainSc: 0-No pain   Pain Goal:                   [name]

## 2020-11-11 NOTE — Progress Notes (Signed)
Intermittent pressure. CE posterior lip, 0 station. Cat 1 tracing, early decels, anticipate SVD

## 2020-11-11 NOTE — Progress Notes (Signed)
Labor Note  S: No complaints, denies BP symptoms  O: BP 120/81   Pulse (!) 117   Temp 97.9 F (36.6 C) (Oral)   Resp 18   Ht 5\' 4"  (1.626 m)   Wt 104 kg   SpO2 100%   BMI 39.34 kg/m  CE: 5/60/-2 FHR: Baseline 140, +accels, -decels, modvariability TOCO q5-47m, pitocin at 45mU/min  uPC 0.2, Cr 0.68, 25/13, 12/35.4/327 BP WNL  A/P: This is a 35 y.o. 20 at [redacted]w[redacted]d  admitted for IOL for GHTN FWB: Cat 1 MWB: s/p epidural, asx from HTN Labor course: s/p AROM at 0140, continue to titrate pitocin  Anticipate SVD

## 2020-11-11 NOTE — Lactation Note (Signed)
This note was copied from a baby's chart. Lactation Consultation Note  Patient Name: Boy Chimene Salo [id number] Date: 11/11/2020 Reason for consult: Initial assessment;Early term 37-38.6wks;Difficult latch Age:35 hours   P3 mom with BF exp.  +breast changes during pregnancy  Mom told LC upon initial visit she felt her infant had a tongue and lip tie.  She had both previous children's tongues revised one, (her now 35 yr old) at 92 months one, her 35 year old, at 50 days old.  She used a NS during the hospital stay with her 2nd child and felt it helped with latching.  She did have engorgement issues on day 2 PP with her second child and had an oversupply during breastfeeding.  LC observed infant's mouth and the anterior lingual frenulum was easily visible.  At time of oral assessment and suck assessment, tongue did not extend past gumline and chomping was felt.  Gloved finger was easily pulled from mouth when infant sucked.    Infant was placed STS and began rooting.  Infant open widely to latch and after a few sucks would lose the latch and have his lips pursed around the nipple.  Even after sandwiching the tissue and holding the tissue after a few sucks, then infant lost the seal.  Laid back position along with football attempted.  [facility] taught mom how to apply NS and mom correctly applied it.  A rolled burp cloth was used to provide more support to breast tissue.  Infant opened to latch then fell asleep immediately afterwards.  A few more attempts were tried but infant slept STS.    Mom prefers not to use an electric pump but will be okay using a hand pump.  LC highly encouraged her to call for someone to observe the latch with the next feeding for assessment of the feed with the NS.  A spoon and colostrum container were left and mom was encouraged to collect and feed EBM to infant after BF or before.  Family know to call out for assistance with this hand expression is to stimulate supply and to  collect to feed back to infant while working on latching.  Mom will try first to latch without NS, but if he is unable to sustain, will apply NS.   RN aware and knows feeding with shield needs to be observed.  Encouraged family to call LC with next feeding.  Maternal Data Has patient been taught Hand Expression?: Yes Does the patient have breastfeeding experience prior to this delivery?: Yes  Feeding Feeding Type: Breast Fed  LATCH Score Latch: Too sleepy or reluctant, no latch achieved, no sucking elicited.  Audible Swallowing: None  Type of Nipple: Everted at rest and after stimulation  Comfort (Breast/Nipple): Soft / non-tender  Hold (Positioning): Assistance needed to correctly position infant at breast and maintain latch.  LATCH Score: 5  Interventions Interventions: Breast feeding basics reviewed;Assisted with latch;Skin to skin;Breast massage;Hand express;Position options;Support pillows;Adjust position;Breast compression  Lactation Tools Discussed/Used WIC Program: No   Consult Status Date: 11/12/20 Follow-up type: In-patient    Maryruth [facility] 11/11/2020, 8:07 PM

## 2020-11-11 NOTE — Anesthesia Procedure Notes (Signed)
Epidural Patient location during procedure: OB Start time: 11/11/2020 12:11 AM End time: 11/11/2020 12:15 AM  Staffing Anesthesiologist: Bethena Midget, MD  Preanesthetic Checklist Completed: patient identified, IV checked, site marked, risks and benefits discussed, surgical consent, monitors and equipment checked, pre-op evaluation and timeout performed  Epidural Patient position: sitting Prep: DuraPrep and site prepped and draped Patient monitoring: continuous pulse ox and blood pressure Approach: midline Location: L3-L4 Injection technique: LOR air  Needle:  Needle type: Tuohy  Needle gauge: 17 G Needle length: 9 cm and 9 Needle insertion depth: 8 cm Catheter type: closed end flexible Catheter size: 19 Gauge Catheter at skin depth: 13 cm Test dose: negative  Assessment Events: blood not aspirated, injection not painful, no injection resistance, no paresthesia and negative IV test

## 2020-11-11 NOTE — Progress Notes (Signed)
MOB was referred for history of depression/anxiety. * Referral screened out by Clinical Social Worker because none of the following criteria appear to apply: ~ History of anxiety/depression during this pregnancy, or of post-partum depression following prior delivery. ~ Diagnosis of anxiety and/or depression within last 3 years OR * MOB's symptoms currently being treated with medication and/or therapy. Per chart review and OB records review, MOB is currently prescribed/taking Buspar.  Please contact the Clinical Social Worker if needs arise, by MOB request, or if MOB scores greater than 9/yes to question 10 on Edinburgh Postpartum Depression Screen.  Evy Lutterman, LCSW Clinical Social Worker [facility] Cell#: [phone]  

## 2020-11-12 LAB — CBC
HCT: 31.3 % — ABNORMAL LOW (ref 36.0–46.0)
Hemoglobin: 10.5 g/dL — ABNORMAL LOW (ref 12.0–15.0)
MCH: 28.7 pg (ref 26.0–34.0)
MCHC: 33.5 g/dL (ref 30.0–36.0)
MCV: 85.5 fL (ref 80.0–100.0)
Platelets: 280 10*3/uL (ref 150–400)
RBC: 3.66 MIL/uL — ABNORMAL LOW (ref 3.87–5.11)
RDW: 16.2 % — ABNORMAL HIGH (ref 11.5–15.5)
WBC: 14.8 10*3/uL — ABNORMAL HIGH (ref 4.0–10.5)
nRBC: 0 % (ref 0.0–0.2)

## 2020-11-12 MED ORDER — IBUPROFEN 800 MG PO TABS: 800.0000 mg | ORAL_TABLET | Freq: Three times a day (TID) | ORAL | 1 refills | Status: AC | PRN

## 2020-11-12 NOTE — Progress Notes (Signed)
POSTPARTUM PROGRESS NOTE  Post Partum Day #1  Subjective:  Patient soundly asleep this morning. Per RN, no acute events overnight.  Per pt's husband, denies problems with ambulating, voiding or po intake.  Denies nausea or vomiting.  Pr RN, no reports of PreE symptoms. Chart review shows difficult latch by LC, will likely f/u later  Objective: Blood pressure (!) 118/97, pulse 92, temperature (!) 97.5 F (36.4 C), temperature source Oral, resp. rate 20, height 5\' 4"  (1.626 m), weight 104 kg, SpO2 100 %, unknown if currently breastfeeding.  Physical Exam:  General: sleeping soundly but rousable Lochia:normal flow Chest: CTAB Heart: RRR no m/r/g Abdomen: +BS, soft, nontender Uterine Fundus: firm, 2cm below umbilicus GU: deferred 2/2 sleep Extremities: neg edema, neg calf TTP BL, neg Homans BL  Recent Labs    11/11/20 1211 11/12/20 0524  HGB 11.7* 10.5*  HCT 36.9 31.3*    Assessment/Plan:  ASSESSMENT: Laura Jenkins is a 35 y.o. 20 s/p SVD @ [redacted]w[redacted]d. PNC c/b GHTN, anxiety on meds, asthma, HSV, CF carrier.   Plan for discharge tomorrow, Breastfeeding, Lactation consult and Circumcision prior to discharge   LOS: 2 days

## 2020-11-12 NOTE — Progress Notes (Signed)
Patient called to speak to MD about possible discharge. DBP today has been persistently upper 90s. Would like to at least monitor patient until late afternoon with blood pressure/GHTN history. Regarding lip/tongue tie, pt's other children had this and she is comfortable managing at home. Pt amenable to continued monitoring until afternoon with discharge at that time if BP appropriate.

## 2020-11-12 NOTE — Lactation Note (Signed)
This note was copied from a baby's chart. Lactation Consultation Note  Patient Name: Laura Jenkins [id number] Date: 11/12/2020   Age:35 hours  P3 mother whose infant is now 50 hours old.  This is an ETI at 38+3 weeks.  Mother breast fed her first two children (now 57 and 32 years old).  Both children had lip/tongue ties and mother feels like this baby does also.  She is planning on getting a consult with a specialist.    Due to the latching difficulty, mother prefers to use a NS and informed me that she wants to use one until she sees a specialist with this baby.  I acknowledged her wishes and confirmed that this was appropriate.  I did not assess at this time due to baby being asleep.  Previous LC had noted this.  Mother will continue to feed on demand or at least every 3 hours due to early gestational age.  She is familiar with hand expression and will feed back any EBM she obtains to baby.    Reviewed engorgement prevention/treatment.  Mother has a manual pump at bedside; does not prefer to use electric pumps but has one if needed.  She also has our OP phone number for any further questions/concerns after discharge.    Baby has been discharged by MD.  Father present.    Maternal Data    Feeding Feeding Type: Breast Fed  LATCH Score                   Interventions    Lactation Tools Discussed/Used     Consult Status Consult Status: Complete Date: 11/13/20 Follow-up type: Call as needed    Culley Hedeen R Farzad Tibbetts 11/12/2020, 10:54 AM

## 2020-11-12 NOTE — Progress Notes (Signed)
Blood pressures since noon at been normotensive, patient asymptomatic. Baby boy s/p circ. At this time, discharge home with STRICT BP precautions, plan to bring back to clinic in 1 wk for BP check.

## 2020-11-12 NOTE — Discharge Summary (Signed)
Postpartum Discharge Summary  Date of Service updated     Patient Name: Laura Jenkins DOB: [birth year] MRN: [id number]  Date of admission: 11/10/2020 Delivery date:11/11/2020  Delivering provider: Carlisle Cater  Date of discharge: 11/12/2020  Admitting diagnosis: Gestational (pregnancy-induced) hypertension without significant proteinuria, complicating childbirth [O13.4] Intrauterine pregnancy: [redacted]w[redacted]d     Secondary diagnosis:  Active Problems:   Gestational (pregnancy-induced) hypertension without significant proteinuria, complicating childbirth  Additional problems: none    Discharge diagnosis: Term Pregnancy Delivered and Gestational Hypertension                                              Post partum procedures:none Augmentation: AROM and Pitocin Complications: None  Hospital course: Induction of Labor With Vaginal Delivery   35 y.o. yo [phone] at [redacted]w[redacted]d was admitted to the hospital 11/10/2020 for induction of labor.  Indication for induction: Gestational hypertension.  Patient had an uncomplicated labor course as follows: Membrane Rupture Time/Date: 1:40 AM ,11/11/2020   Delivery Method:Vaginal, Spontaneous  Episiotomy: None  Lacerations:  Labial  Details of delivery can be found in separate delivery note.  Patient had a routine postpartum course. Patient is discharged home 11/12/20.  Newborn Data: Birth date:11/11/2020  Birth time:11:15 AM  Gender:Female  Living status:Living  Apgars:9 ,9  Weight:3581 g     Physical exam  Vitals:   11/12/20 0635 11/12/20 1241 11/12/20 1425 11/12/20 1603  BP: (!) 118/97 119/84 124/88 120/83  Pulse: 92 83  86  Resp: 20   18  Temp:    97.8 F (36.6 C)  TempSrc:    Oral  SpO2: 100%   100%  Weight:      Height:       General: alert, cooperative and no distress Lochia: appropriate Uterine Fundus: firm Incision: N/A DVT Evaluation: No evidence of DVT seen on physical exam. Negative Homan's sign. No cords or  calf tenderness. Labs: Lab Results  Component Value Date   WBC 14.8 (H) 11/12/2020   HGB 10.5 (L) 11/12/2020   HCT 31.3 (L) 11/12/2020   MCV 85.5 11/12/2020   PLT 280 11/12/2020   CMP Latest Ref Rng & Units 11/10/2020  Glucose 70 - 99 mg/dL 944(H)  BUN 6 - 20 mg/dL <6(P)  Creatinine 5.91 - 1.00 mg/dL 6.38  Sodium 466 - 599 mmol/L 135  Potassium 3.5 - 5.1 mmol/L 3.7  Chloride 98 - 111 mmol/L 106  CO2 22 - 32 mmol/L 17(L)  Calcium 8.9 - 10.3 mg/dL 9.1  Total Protein 6.5 - 8.1 g/dL 3.5(T)  Total Bilirubin 0.3 - 1.2 mg/dL 0.4  Alkaline Phos 38 - 126 U/L 125  AST 15 - 41 U/L 25  ALT 0 - 44 U/L 16   Edinburgh Score: Edinburgh Postnatal Depression Scale Screening Tool 11/12/2020  I have been able to laugh and see the funny side of things. 0  I have looked forward with enjoyment to things. 0  I have blamed myself unnecessarily when things went wrong. 0  I have been anxious or worried for no good reason. 0  I have felt scared or panicky for no good reason. 0  Things have been getting on top of me. 0  I have been so unhappy that I have had difficulty sleeping. 0  I have felt sad or miserable. 0  I have been so unhappy that I  have been crying. 0  The thought of harming myself has occurred to me. 0  Edinburgh Postnatal Depression Scale Total 0      After visit meds:  Allergies as of 11/12/2020   No Active Allergies     Medication List    STOP taking these medications   cimetidine 200 MG tablet Commonly known as: TAGAMET   valACYclovir 1000 MG tablet Commonly known as: VALTREX     TAKE these medications   albuterol 108 (90 Base) MCG/ACT inhaler Commonly known as: VENTOLIN HFA Inhale 1-2 puffs into the lungs every 6 (six) hours as needed for wheezing or shortness of breath.   busPIRone 15 MG tablet Commonly known as: BUSPAR Take 1 tablet (15 mg total) by mouth 2 (two) times daily.   cetirizine 10 MG tablet Commonly known as: ZYRTEC Take 10 mg by mouth daily.   [employer]  PRENATAL GUMMY PO Take 2 each by mouth daily.   docusate sodium 100 MG capsule Commonly known as: COLACE Take 100 mg by mouth daily as needed for mild constipation.   fluticasone 44 MCG/ACT inhaler Commonly known as: FLOVENT HFA Flovent HFA 44 mcg/actuation aerosol inhaler  TAKE 2 PUFFS BY MOUTH TWICE A DAY   ibuprofen 800 MG tablet Commonly known as: ADVIL Take 1 tablet (800 mg total) by mouth every 8 (eight) hours as needed. What changed:   medication strength  how much to take  when to take this  reasons to take this        Discharge home in stable condition Infant Feeding: Breast Infant Disposition:home with mother Discharge instruction: per After Visit Summary and Postpartum booklet. Activity: Advance as tolerated. Pelvic rest for 6 weeks.  Diet: routine diet Anticipated Birth Control: Unsure Postpartum Appointment:6 weeks Additional Postpartum F/U: BP check 1 week Future Appointments: Future Appointments  Date Time Provider Department Center  11/18/2020 10:00 AM [facility] None   Follow up Visit:      11/12/2020 Carlisle Cater, MD

## 2020-11-18 ENCOUNTER — Other Ambulatory Visit (HOSPITAL_COMMUNITY): Payer: Medicaid Other

## 2020-11-20 ENCOUNTER — Inpatient Hospital Stay (HOSPITAL_COMMUNITY)
Admission: AD | Admit: 2020-11-20 | Payer: Medicaid Other | Source: Home / Self Care | Admitting: Obstetrics and Gynecology

## 2020-11-20 ENCOUNTER — Inpatient Hospital Stay (HOSPITAL_COMMUNITY): Payer: Medicaid Other

## 2022-05-29 ENCOUNTER — Ambulatory Visit: Payer: Medicaid Other | Admitting: Podiatry

## 2022-05-30 ENCOUNTER — Ambulatory Visit: Payer: Medicaid Other | Admitting: Podiatry

## 2022-05-31 ENCOUNTER — Ambulatory Visit: Payer: Medicaid Other | Admitting: Podiatry

## 2022-05-31 DIAGNOSIS — L600 Ingrowing nail: Secondary | ICD-10-CM | POA: Diagnosis not present

## 2022-05-31 MED ORDER — CEPHALEXIN 500 MG PO CAPS: 500.0000 mg | ORAL_CAPSULE | Freq: Three times a day (TID) | ORAL | 0 refills | Status: AC

## 2022-05-31 NOTE — Patient Instructions (Signed)

## 2022-05-31 NOTE — Progress Notes (Signed)
Subjective:   Patient ID: Laura Jenkins, female   DOB: 36 y.o.   MRN: [id number]   HPI Chief Complaint  Patient presents with   Ingrown Toenail    Pt came in today for a ingrown toenail on the right hallux lateral border, pt is not having any pain, redness, or swelling, TX: pt has been cutting the ingown herself or going to the nail salon to get it cut,    36 year old female with the above complaints.  She states the ingrown toenail started about 2 years ago.  She states that now she is cut her nail shoulders Hurting.  Denies any drainage or pus.  She tries to cut out herself and she also gets pedicures.  She is prediabetic, on metformin. She does not check her BS regularly as of yet. She is currently being worked up for weight loss surgery.   Review of Systems  All other systems reviewed and are negative.  Past Medical History:  Diagnosis Date   Anxiety    Asthma    Depression    Indication for care in labor or delivery 09/26/2015   Medical history non-contributory    Pregnancy induced hypertension    Seasonal allergies     Past Surgical History:  Procedure Laterality Date   EYE SURGERY     STRABISMUS SURGERY  1989   toe surgery       Current Outpatient Medications:    cephALEXin (KEFLEX) 500 MG capsule, Take 1 capsule (500 mg total) by mouth 3 (three) times daily., Disp: 21 capsule, Rfl: 0   montelukast (SINGULAIR) 10 MG tablet, Take 1 tablet by mouth daily., Disp: , Rfl:    albuterol (PROVENTIL HFA;VENTOLIN HFA) 108 (90 Base) MCG/ACT inhaler, Inhale 1-2 puffs into the lungs every 6 (six) hours as needed for wheezing or shortness of breath., Disp: , Rfl:    busPIRone (BUSPAR) 15 MG tablet, Take 1 tablet (15 mg total) by mouth 2 (two) times daily., Disp: 60 tablet, Rfl: 2   cetirizine (ZYRTEC) 10 MG tablet, Take 10 mg by mouth daily., Disp: , Rfl:    fluticasone (FLOVENT HFA) 44 MCG/ACT inhaler, Flovent HFA 44 mcg/actuation aerosol inhaler  TAKE 2 PUFFS BY  MOUTH TWICE A DAY, Disp: , Rfl:    ibuprofen (ADVIL) 800 MG tablet, Take 1 tablet (800 mg total) by mouth every 8 (eight) hours as needed., Disp: 60 tablet, Rfl: 1   metFORMIN (GLUCOPHAGE-XR) 500 MG 24 hr tablet, Take 500 mg by mouth every morning., Disp: , Rfl:    valACYclovir (VALTREX) 1000 MG tablet, Take 1 tablet by mouth daily as needed., Disp: , Rfl:   No Active Allergies         Objective:  Physical Exam  General: AAO x3, NAD  Dermatological: Incurvation present to right lateral hallux nail border with localized edema but there is no drainage or pus or erythema.  No fluctuance or crepitation.  No open lesions.  Vascular: Dorsalis Pedis artery and Posterior Tibial artery pedal pulses are 2/4 bilateral with immedate capillary fill time.  There is no pain with calf compression, swelling, warmth, erythema.   Neruologic: Grossly intact via light touch bilateral.    Musculoskeletal: She has tenderness along lateral nail border of the hallux toenail.  No other areas of discomfort.  Muscular strength 5/5 in all groups tested bilateral.  Gait: Unassisted, Nonantalgic.       Assessment:   Right lateral hallux ingrown toenail     Plan:  -Treatment  options discussed including all alternatives, risks, and complications. -Etiology of symptoms were discussed -At this time, the patient is requesting partial nail removal with chemical matricectomy to the symptomatic portion of the nail. Risks and complications were discussed with the patient for which they understand and written consent was obtained. Under sterile conditions a total of 3 mL of a mixture of 2% lidocaine plain and 0.5% Marcaine plain was infiltrated in a hallux block fashion. Once anesthetized, the skin was prepped in sterile fashion. A tourniquet was then applied. Next the lateral aspect of hallux nail border was then sharply excised making sure to remove the entire offending nail border. Once the nails were ensured to be  removed area was debrided and the underlying skin was intact. There is no purulence identified in the procedure. Next phenol was then applied under standard conditions and copiously irrigated. Silvadene was applied. A dry sterile dressing was applied. After application of the dressing the tourniquet was removed and there is found to be an immediate capillary refill time to the digit. The patient tolerated the procedure well any complications. Post procedure instructions were discussed the patient for which he verbally understood. Follow-up in one week for nail check or sooner if any problems are to arise. Discussed signs/symptoms of infection and directed to call the office immediately should any occur or go directly to the emergency room. In the meantime, encouraged to call the office with any questions, concerns, changes symptoms. -Keflex  Vivi Barrack DPM

## 2022-06-17 ENCOUNTER — Ambulatory Visit: Payer: Medicaid Other | Admitting: *Deleted

## 2022-06-17 DIAGNOSIS — Z9889 Other specified postprocedural states: Secondary | ICD-10-CM

## 2022-06-17 DIAGNOSIS — L600 Ingrowing nail: Secondary | ICD-10-CM

## 2022-06-17 NOTE — Progress Notes (Signed)
Patient presents for nail check of the hallux right.  She is s/p chemical matrixectomy hallux right, lateral border performed on 05/31/2022.  The toe is healing nicely. No signs of infection. She is no longer soaking or bandaging. She says she was prescribed keflex, however, she never started it. She didn't feel it was necessary unless it didn't heal.  Advised she could just monitor the area, no soaking or covering is needed at this time. Discussed signs of infection and advised to start the antibiotic if toe worsened and to call the office for an appointment for follow up.  She will follow up as needed.

## 2022-10-08 ENCOUNTER — Other Ambulatory Visit: Payer: Self-pay | Admitting: Podiatry

## 2023-11-06 ENCOUNTER — Encounter: Payer: Self-pay | Admitting: Podiatry

## 2023-11-06 ENCOUNTER — Ambulatory Visit (INDEPENDENT_AMBULATORY_CARE_PROVIDER_SITE_OTHER): Payer: 59 | Admitting: Podiatry

## 2023-11-06 DIAGNOSIS — L600 Ingrowing nail: Secondary | ICD-10-CM

## 2023-11-06 NOTE — Patient Instructions (Signed)

## 2023-11-06 NOTE — Progress Notes (Signed)
  Subjective:  Patient ID: Laura Jenkins, female    DOB: 06/16/1986,  MRN: [id number]  Chief Complaint  Patient presents with   Ingrown Toenail    Patient says she notice some pain on left hallux , no medication for pain , it has been about a week .    38 y.o. female presents with pain on left hallux, concern about ingrown nail.   Past Medical History:  Diagnosis Date   Anxiety    Asthma    Depression    Indication for care in labor or delivery 09/26/2015   Medical history non-contributory    Pregnancy induced hypertension    Seasonal allergies     No Active Allergies  ROS: Negative except as per HPI above  Objective:  General: AAO x3, NAD  Dermatological: Incurvation is present along the bilateral nail border of the left great toe. There is localized edema without any erythema or increase in warmth around the nail border. There is no drainage or pus. There is no ascending cellulitis. No malodor. No open lesions or pre-ulcerative lesions.    Vascular:  Dorsalis Pedis artery and Posterior Tibial artery pedal pulses are 2/4 bilateral.  Capillary fill time < 3 sec to all digits.   Neruologic: Grossly intact via light touch bilateral. Protective threshold intact to all sites bilateral.   Musculoskeletal: No gross boney pedal deformities bilateral. No pain, crepitus, or limitation noted with foot and ankle range of motion bilateral. Muscular strength 5/5 in all groups tested bilateral.  Gait: Unassisted, Nonantalgic.   No images are attached to the encounter.   Assessment:   1. Ingrown nail of great toe of left foot      Plan:  Patient was evaluated and treated and all questions answered.    Ingrown Nail, left -Patient elects to proceed with minor surgery to remove ingrown toenail today. Consent reviewed and signed by patient. -Ingrown nail excised. See procedure note. -Educated on post-procedure care including soaking. Written instructions provided  and reviewed. -Patient to follow up in 2 weeks for nail check.  Procedure: Excision of Ingrown Toenail Location: Left 1st toe bilateral nail borders. Anesthesia: Lidocaine  1% plain; 1.5 mL and Marcaine  0.5% plain; 1.5 mL, digital block. Skin Prep: Betadine. Dressing: Silvadene; telfa; dry, sterile, compression dressing. Technique: Following skin prep, the toe was exsanguinated and a tourniquet was secured at the base of the toe. The affected nail border was freed, split with a nail splitter, and excised. Chemical matrixectomy was then performed with phenol and irrigated out with alcohol (if phenol) or vinegar (if NaOH). The tourniquet was then removed and sterile dressing applied. Disposition: Patient tolerated procedure well.           Marolyn JULIANNA Honour, DPM [facility] / [facility]

## 2024-09-30 ENCOUNTER — Other Ambulatory Visit: Payer: Self-pay | Admitting: Medical Genetics
